# Patient Record
Sex: Female | Born: 1935 | Race: White | Hispanic: No | Smoking: Former smoker
Health system: Southern US, Community
[De-identification: ages and names within clinical notes are randomized; demographics above are authoritative.]

## PROBLEM LIST (undated history)

## (undated) DIAGNOSIS — I2510 Atherosclerotic heart disease of native coronary artery without angina pectoris: Secondary | ICD-10-CM

## (undated) DIAGNOSIS — I4729 Other ventricular tachycardia: Secondary | ICD-10-CM

## (undated) DIAGNOSIS — I350 Nonrheumatic aortic (valve) stenosis: Secondary | ICD-10-CM

## (undated) DIAGNOSIS — J449 Chronic obstructive pulmonary disease, unspecified: Secondary | ICD-10-CM

## (undated) DIAGNOSIS — I255 Ischemic cardiomyopathy: Secondary | ICD-10-CM

## (undated) DIAGNOSIS — F0390 Unspecified dementia without behavioral disturbance: Secondary | ICD-10-CM

## (undated) DIAGNOSIS — E119 Type 2 diabetes mellitus without complications: Secondary | ICD-10-CM

## (undated) DIAGNOSIS — I10 Essential (primary) hypertension: Secondary | ICD-10-CM

## (undated) DIAGNOSIS — F32A Depression, unspecified: Secondary | ICD-10-CM

## (undated) DIAGNOSIS — J45909 Unspecified asthma, uncomplicated: Secondary | ICD-10-CM

## (undated) DIAGNOSIS — Z9861 Coronary angioplasty status: Principal | ICD-10-CM

## (undated) DIAGNOSIS — I472 Ventricular tachycardia: Secondary | ICD-10-CM

## (undated) DIAGNOSIS — M47816 Spondylosis without myelopathy or radiculopathy, lumbar region: Secondary | ICD-10-CM

## (undated) DIAGNOSIS — F419 Anxiety disorder, unspecified: Secondary | ICD-10-CM

## (undated) DIAGNOSIS — R739 Hyperglycemia, unspecified: Secondary | ICD-10-CM

## (undated) DIAGNOSIS — E7800 Pure hypercholesterolemia, unspecified: Secondary | ICD-10-CM

## (undated) DIAGNOSIS — F329 Major depressive disorder, single episode, unspecified: Secondary | ICD-10-CM

## (undated) DIAGNOSIS — I2102 ST elevation (STEMI) myocardial infarction involving left anterior descending coronary artery: Secondary | ICD-10-CM

## (undated) HISTORY — DX: Depression, unspecified: F32.A

## (undated) HISTORY — DX: Pure hypercholesterolemia, unspecified: E78.00

## (undated) HISTORY — DX: Type 2 diabetes mellitus without complications: E11.9

## (undated) HISTORY — DX: Unspecified asthma, uncomplicated: J45.909

## (undated) HISTORY — DX: Unspecified dementia, unspecified severity, without behavioral disturbance, psychotic disturbance, mood disturbance, and anxiety: F03.90

## (undated) HISTORY — DX: Nonrheumatic aortic (valve) stenosis: I35.0

## (undated) HISTORY — DX: Essential (primary) hypertension: I10

## (undated) HISTORY — DX: Coronary angioplasty status: Z98.61

## (undated) HISTORY — DX: Atherosclerotic heart disease of native coronary artery without angina pectoris: I25.10

## (undated) HISTORY — DX: Chronic obstructive pulmonary disease, unspecified: J44.9

## (undated) HISTORY — DX: Hyperglycemia, unspecified: R73.9

## (undated) HISTORY — DX: Major depressive disorder, single episode, unspecified: F32.9

## (undated) HISTORY — PX: FRACTURE SURGERY: SHX138

## (undated) HISTORY — PX: CARDIAC SURGERY: SHX584

## (undated) HISTORY — DX: ST elevation (STEMI) myocardial infarction involving left anterior descending coronary artery: I21.02

## (undated) HISTORY — PX: APPENDECTOMY: SHX54

## (undated) HISTORY — DX: Anxiety disorder, unspecified: F41.9

---

## 1998-01-05 ENCOUNTER — Encounter: Payer: Self-pay | Admitting: Gastroenterology

## 1998-01-05 ENCOUNTER — Inpatient Hospital Stay (HOSPITAL_COMMUNITY): Admission: EM | Admit: 1998-01-05 | Discharge: 1998-01-20 | Payer: Self-pay | Admitting: Emergency Medicine

## 1998-01-07 ENCOUNTER — Encounter: Payer: Self-pay | Admitting: Gastroenterology

## 1998-01-10 ENCOUNTER — Encounter: Payer: Self-pay | Admitting: Pulmonary Disease

## 1998-01-10 ENCOUNTER — Encounter: Payer: Self-pay | Admitting: Gastroenterology

## 1998-01-11 ENCOUNTER — Encounter: Payer: Self-pay | Admitting: Pulmonary Disease

## 1998-01-12 ENCOUNTER — Encounter: Payer: Self-pay | Admitting: Pulmonary Disease

## 1998-01-13 ENCOUNTER — Encounter: Payer: Self-pay | Admitting: Pulmonary Disease

## 1998-01-14 ENCOUNTER — Encounter: Payer: Self-pay | Admitting: Pulmonary Disease

## 1998-01-18 ENCOUNTER — Encounter: Payer: Self-pay | Admitting: Gastroenterology

## 1998-05-18 ENCOUNTER — Ambulatory Visit (HOSPITAL_BASED_OUTPATIENT_CLINIC_OR_DEPARTMENT_OTHER): Admission: RE | Admit: 1998-05-18 | Discharge: 1998-05-18 | Payer: Self-pay | Admitting: Orthopedic Surgery

## 1998-05-25 ENCOUNTER — Other Ambulatory Visit: Admission: RE | Admit: 1998-05-25 | Discharge: 1998-05-25 | Payer: Self-pay | Admitting: Family Medicine

## 1998-06-15 ENCOUNTER — Ambulatory Visit (HOSPITAL_BASED_OUTPATIENT_CLINIC_OR_DEPARTMENT_OTHER): Admission: RE | Admit: 1998-06-15 | Discharge: 1998-06-15 | Payer: Self-pay | Admitting: Orthopedic Surgery

## 1998-06-23 ENCOUNTER — Encounter: Admission: RE | Admit: 1998-06-23 | Discharge: 1998-08-13 | Payer: Self-pay | Admitting: Orthopedic Surgery

## 1998-08-17 ENCOUNTER — Encounter: Admission: RE | Admit: 1998-08-17 | Discharge: 1998-09-15 | Payer: Self-pay | Admitting: Orthopedic Surgery

## 2000-05-21 ENCOUNTER — Encounter: Payer: Self-pay | Admitting: Internal Medicine

## 2000-05-21 ENCOUNTER — Emergency Department (HOSPITAL_COMMUNITY): Admission: EM | Admit: 2000-05-21 | Discharge: 2000-05-21 | Payer: Self-pay | Admitting: Internal Medicine

## 2000-06-20 ENCOUNTER — Encounter: Payer: Self-pay | Admitting: Orthopedic Surgery

## 2000-06-20 ENCOUNTER — Encounter: Admission: RE | Admit: 2000-06-20 | Discharge: 2000-06-20 | Payer: Self-pay | Admitting: Orthopedic Surgery

## 2000-08-31 ENCOUNTER — Encounter: Payer: Self-pay | Admitting: Neurosurgery

## 2000-08-31 ENCOUNTER — Encounter: Admission: RE | Admit: 2000-08-31 | Discharge: 2000-08-31 | Payer: Self-pay | Admitting: Neurosurgery

## 2000-09-21 ENCOUNTER — Encounter: Payer: Self-pay | Admitting: Thoracic Surgery

## 2000-09-21 ENCOUNTER — Encounter: Admission: RE | Admit: 2000-09-21 | Discharge: 2000-09-21 | Payer: Self-pay | Admitting: Thoracic Surgery

## 2000-11-20 ENCOUNTER — Encounter: Admission: RE | Admit: 2000-11-20 | Discharge: 2000-11-20 | Payer: Self-pay | Admitting: Thoracic Surgery

## 2000-11-20 ENCOUNTER — Encounter: Payer: Self-pay | Admitting: Thoracic Surgery

## 2001-06-13 ENCOUNTER — Emergency Department (HOSPITAL_COMMUNITY): Admission: EM | Admit: 2001-06-13 | Discharge: 2001-06-13 | Payer: Self-pay | Admitting: Emergency Medicine

## 2001-10-28 ENCOUNTER — Inpatient Hospital Stay (HOSPITAL_COMMUNITY): Admission: EM | Admit: 2001-10-28 | Discharge: 2001-11-05 | Payer: Self-pay | Admitting: Emergency Medicine

## 2001-10-28 ENCOUNTER — Encounter: Payer: Self-pay | Admitting: Gastroenterology

## 2001-10-28 ENCOUNTER — Encounter: Payer: Self-pay | Admitting: General Surgery

## 2004-03-03 ENCOUNTER — Inpatient Hospital Stay (HOSPITAL_COMMUNITY): Admission: EM | Admit: 2004-03-03 | Discharge: 2004-03-11 | Payer: Self-pay | Admitting: Emergency Medicine

## 2004-03-08 ENCOUNTER — Encounter: Payer: Self-pay | Admitting: Cardiology

## 2005-01-04 ENCOUNTER — Inpatient Hospital Stay (HOSPITAL_COMMUNITY): Admission: EM | Admit: 2005-01-04 | Discharge: 2005-01-10 | Payer: Self-pay | Admitting: Emergency Medicine

## 2005-06-13 ENCOUNTER — Inpatient Hospital Stay (HOSPITAL_COMMUNITY): Admission: EM | Admit: 2005-06-13 | Discharge: 2005-06-15 | Payer: Self-pay | Admitting: Emergency Medicine

## 2010-06-13 ENCOUNTER — Emergency Department (HOSPITAL_COMMUNITY): Payer: No Typology Code available for payment source

## 2010-06-13 ENCOUNTER — Emergency Department (HOSPITAL_COMMUNITY)
Admission: EM | Admit: 2010-06-13 | Discharge: 2010-06-13 | Disposition: A | Discharge status: Home / Self Care | Payer: No Typology Code available for payment source | Attending: Emergency Medicine | Admitting: Emergency Medicine

## 2010-06-13 DIAGNOSIS — S60229A Contusion of unspecified hand, initial encounter: Secondary | ICD-10-CM | POA: Insufficient documentation

## 2010-06-13 DIAGNOSIS — M79609 Pain in unspecified limb: Secondary | ICD-10-CM | POA: Insufficient documentation

## 2010-06-13 DIAGNOSIS — IMO0002 Reserved for concepts with insufficient information to code with codable children: Secondary | ICD-10-CM | POA: Insufficient documentation

## 2010-06-13 DIAGNOSIS — I10 Essential (primary) hypertension: Secondary | ICD-10-CM | POA: Insufficient documentation

## 2010-06-13 DIAGNOSIS — Y929 Unspecified place or not applicable: Secondary | ICD-10-CM | POA: Insufficient documentation

## 2010-06-13 DIAGNOSIS — S5010XA Contusion of unspecified forearm, initial encounter: Secondary | ICD-10-CM | POA: Insufficient documentation

## 2012-01-20 ENCOUNTER — Emergency Department (HOSPITAL_COMMUNITY)
Admission: EM | Admit: 2012-01-20 | Discharge: 2012-01-20 | Disposition: A | Discharge status: Home / Self Care | Payer: Medicare Other | Attending: Emergency Medicine | Admitting: Emergency Medicine

## 2012-01-20 ENCOUNTER — Encounter (HOSPITAL_COMMUNITY): Payer: Self-pay | Admitting: Emergency Medicine

## 2012-01-20 ENCOUNTER — Emergency Department (HOSPITAL_COMMUNITY): Payer: Medicare Other

## 2012-01-20 DIAGNOSIS — S32509A Unspecified fracture of unspecified pubis, initial encounter for closed fracture: Secondary | ICD-10-CM | POA: Insufficient documentation

## 2012-01-20 DIAGNOSIS — Y9301 Activity, walking, marching and hiking: Secondary | ICD-10-CM | POA: Insufficient documentation

## 2012-01-20 DIAGNOSIS — F17200 Nicotine dependence, unspecified, uncomplicated: Secondary | ICD-10-CM | POA: Insufficient documentation

## 2012-01-20 DIAGNOSIS — R296 Repeated falls: Secondary | ICD-10-CM | POA: Insufficient documentation

## 2012-01-20 DIAGNOSIS — Z7982 Long term (current) use of aspirin: Secondary | ICD-10-CM | POA: Insufficient documentation

## 2012-01-20 DIAGNOSIS — Z8739 Personal history of other diseases of the musculoskeletal system and connective tissue: Secondary | ICD-10-CM | POA: Insufficient documentation

## 2012-01-20 DIAGNOSIS — R6883 Chills (without fever): Secondary | ICD-10-CM | POA: Insufficient documentation

## 2012-01-20 DIAGNOSIS — Y9289 Other specified places as the place of occurrence of the external cause: Secondary | ICD-10-CM | POA: Insufficient documentation

## 2012-01-20 DIAGNOSIS — S32599A Other specified fracture of unspecified pubis, initial encounter for closed fracture: Secondary | ICD-10-CM

## 2012-01-20 HISTORY — DX: Spondylosis without myelopathy or radiculopathy, lumbar region: M47.816

## 2012-01-20 MED ORDER — DOCUSATE SODIUM 100 MG PO CAPS: 100.0000 mg | ORAL_CAPSULE | Freq: Two times a day (BID) | ORAL | Status: DC

## 2012-01-20 MED ORDER — MORPHINE SULFATE 4 MG/ML IJ SOLN
6.0000 mg | Freq: Once | INTRAMUSCULAR | Status: AC
  Administered 2012-01-20: 6 mg via INTRAMUSCULAR
  Filled 2012-01-20: qty 2

## 2012-01-20 MED ORDER — HYDROCODONE-ACETAMINOPHEN 5-325 MG PO TABS: ORAL_TABLET | ORAL | Status: DC

## 2012-01-20 NOTE — ED Notes (Signed)
WUJ:WJ19<JY> Expected date:01/20/12<BR> Expected time: 8:38 AM<BR> Means of arrival:Ambulance<BR> Comments:<BR> Fall last night

## 2012-01-20 NOTE — ED Notes (Signed)
Per EMS - Pt reportedly fell outside in her yard yesterday evening landing on her buttock. No loss of consciousness, able to ambulated w/o difficulty yesterday. This morning difficulty w/ ambulation, left hip and leg painful. No shortening or external rotation noted. BP - 130/94, HR - 80, Resp - 18.

## 2012-01-20 NOTE — Progress Notes (Signed)
Cm spoke with patient concerning MD order for [facility] services. Pt offered choice of H[facility] agencies. Per pt choice [facility] to provide [facility] services. [facility] notified of referral. Pt request DME, [facility] to deliver DME to bedside. No other needs noted.   Karina Lenderman,RN,BSN

## 2012-01-20 NOTE — Discharge Instructions (Signed)
 Pelvic Fracture, Adult You have a fracture (this means there is a break in the bones) of the pelvis. The pelvis is the ring of bones that make up your hipbones. These are the bones you sit on and the lower part of the spine. It is like a boney ring where your legs attach and which supports your upper body. You have an un-displaced fracture. This means the bones are in good position. The pelvic fracture you have is a simple (uncomplicated) fracture. DIAGNOSIS  X-rays usually diagnose these fractures. TREATMENT  The goals of treating pelvic fractures are to get the bones to heal in a good position. The patient should return to normal activities as soon as possible. Such fractures are often treated with normal bed rest and conservative measures.  HOME CARE INSTRUCTIONS   You should be at bed rest for as long as directed by your caregiver. Following this, you may do usual activities, but avoid strenuous activities for as long as directed by your caregiver.  Only take over-the-counter or prescription medicines for pain, discomfort, or fever as directed by your caregiver.  Bed-rest may also be used for discomfort.  Resume your activities when you are able.  If you develop increased pain or discomfort not relieved with medications, contact your caregiver.  Warning: Do not drive a car or operate a motor vehicle until your caregiver specifically tells you it is safe to do so. SEEK IMMEDIATE MEDICAL CARE IF:   You feel light-headed or faint, develop chest pain or shortness of breath.  An unexplained oral temperature above 102 F (38.9 C) develops.  You develop blood in the urine or in the stools.  There is difficulty urinating, and/or having a bowel movement, or pain with these efforts.  There is a difficulty or increased pain with walking.  There is swelling in one or both legs that is not normal. Document Released: 03/13/2001 Document Revised: 03/27/2011 Document Reviewed:  08/16/2007 [facility] Patient Information 2013 [place], [place].    Narcotic and benzodiazepine use may cause drowsiness, slowed breathing or dependence.  Please use with caution and do not drive, operate machinery or watch young children alone while taking them.  Taking combinations of these medications or drinking alcohol will potentiate these effects.  Narcotics can lead to constipation so consider taking stool softeners while taking narcotic pain medication as well such as colace.

## 2012-01-20 NOTE — ED Provider Notes (Signed)
History     CSN: [id number]  Arrival date & time 01/20/12  0844   First MD Initiated Contact with Patient 01/20/12 [phone]      Chief Complaint  Patient presents with  . Fall  . Leg Pain    (Consider location/radiation/quality/duration/timing/severity/associated sxs/prior treatment) HPI Comments: Patient reports that last night while outside walking her dog, the dog pulled her and she lost her balance and fell onto her left hip. She reports she was able to get up and ambulate and had no obvious deformity. She reports no head injury, loss of consciousness. Chills reports no injuries above her waist. She denies any distal numbness or weakness. She denies any abrasions or lacerations. She is not on any blood thinning medications. She reports that she did take some Tylenol and aspirin last night which did help her discomfort. However overnight and this morning, medications did not help her pain and she reports bearing weight or travel lift her leg in the air causes the discomfort to be much worse. She reports that she does live with her son.   Patient is a 79 y.o. female presenting with fall and leg pain. The history is provided by the patient and the EMS personnel.  Fall Pertinent negatives include no numbness, no abdominal pain and no headaches.  Leg Pain  Pertinent negatives include no numbness.    Past Medical History  Diagnosis Date  . Degenerative arthritis of lumbar spine     Past Surgical History  Procedure Date  . Cardiac surgery     pericardial window  . Appendectomy     History reviewed. No pertinent family history.  History  Substance Use Topics  . Smoking status: Current Every Day Smoker -- 0.5 packs/day    Types: Cigarettes  . Smokeless tobacco: Never Used  . Alcohol Use: No    OB History    Grav Para Term Preterm Abortions TAB SAB Ect Mult Living                  Review of Systems  HENT: Negative for neck pain.   Respiratory: Negative for shortness of  breath.   Cardiovascular: Negative for chest pain.  Gastrointestinal: Negative for abdominal pain.  Musculoskeletal: Positive for arthralgias.  Skin: Negative for wound.  Neurological: Negative for syncope, weakness, numbness and headaches.  All other systems reviewed and are negative.    Allergies  Review of patient's allergies indicates no known allergies.  Home Medications   Current Outpatient Rx  Name  Route  Sig  Dispense  Refill  . ASPIRIN 325 MG PO TABS   Oral   Take 650 mg by mouth daily.         Marland Kitchen DOCUSATE SODIUM 100 MG PO CAPS   Oral   Take 1 capsule (100 mg total) by mouth every 12 (twelve) hours.   30 capsule   0   . HYDROCODONE-ACETAMINOPHEN 5-325 MG PO TABS      1-2 tablets po q 6 hours prn moderate to severe pain   20 tablet   0     BP 142/70  Pulse 93  Temp 98.8 F (37.1 C) (Oral)  Resp 20  SpO2 95%  Physical Exam  Nursing note and vitals reviewed. Constitutional: She appears well-developed and well-nourished.  HENT:  Head: Normocephalic and atraumatic.  Eyes: Pupils are equal, round, and reactive to light. No scleral icterus.  Neck: Normal range of motion. Neck supple.  Cardiovascular: Normal rate, regular rhythm, intact distal pulses  and normal pulses.  Exam reveals no decreased pulses.   No murmur heard. Pulmonary/Chest: Effort normal. No respiratory distress. She has no wheezes.  Abdominal: Soft. She exhibits no distension. There is no tenderness. There is no rebound and no guarding.  Musculoskeletal:       Left hip: She exhibits decreased range of motion and tenderness. She exhibits no crepitus.       Lumbar back: She exhibits no tenderness, no bony tenderness, no pain, no spasm and normal pulse.  Neurological: She is alert. Coordination normal.  Skin: Skin is warm and dry. No rash noted.  Psychiatric: She has a normal mood and affect.    ED Course  Procedures (including critical care time)  Labs Reviewed - No data to display Dg  Hip Complete Left  01/20/2012  *RADIOLOGY REPORT*  Clinical Data: Fall, left hip pain  LEFT HIP - COMPLETE 2+ VIEW  Comparison: No similar prior study is available for comparison.  Findings: Fractures of the left superior and inferior pubic rami are identified.  No left femoral fracture or dislocation.  Mild left hip degenerative change.  Vascular calcifications are noted. Lumbar degenerative change is present.  Sacroiliac joints are within normal limits.  Moderate stool volume noted throughout nondilated visualized portions of colon.  IMPRESSION: Acute left superior and inferior pubic rami fractures.   Original Report Authenticated By: Christiana Pellant, M.D.      1. Fracture of multiple pubic rami     ra sat is 95% and I interpret to be adequate  MDM   I reviewed multiple view pelvic and left hip films myself. I agree with the radiologist interpretation of pubic rami fractures. Patient and family are told results. I feel the patient is able to go home and have given referrals to [facility]. She is prescribed Colace and Norco. I've also ordered a walker as well as recommended case management to assess patient for home health aide and physical therapy.        Gavin Pound. Adalee Kathan, MD 01/20/12 1027

## 2012-01-20 NOTE — ED Notes (Signed)
CSW notified CM of HH request by EDP. CM will provide assistance.   Janann Colonel., MSW, [facility] Weekend ED Clinical Social Worker [phone]

## 2012-01-20 NOTE — ED Notes (Signed)
Discharge instructions reviewed w/ pt., verbalizes understanding. Two prescriptions provided at discharge. 

## 2012-01-20 NOTE — ED Notes (Signed)
Pt was outside walking her dog last p.m., dog chased a cat, jerking the lease causing the pt to fall on her buttock.

## 2013-01-17 DIAGNOSIS — H3560 Retinal hemorrhage, unspecified eye: Secondary | ICD-10-CM | POA: Diagnosis not present

## 2013-01-17 DIAGNOSIS — H353290 Exudative age-related macular degeneration, unspecified eye, stage unspecified: Secondary | ICD-10-CM | POA: Diagnosis not present

## 2013-01-17 DIAGNOSIS — H35059 Retinal neovascularization, unspecified, unspecified eye: Secondary | ICD-10-CM | POA: Diagnosis not present

## 2013-04-01 DIAGNOSIS — H35369 Drusen (degenerative) of macula, unspecified eye: Secondary | ICD-10-CM | POA: Diagnosis not present

## 2013-04-01 DIAGNOSIS — H353290 Exudative age-related macular degeneration, unspecified eye, stage unspecified: Secondary | ICD-10-CM | POA: Diagnosis not present

## 2013-04-01 DIAGNOSIS — H3560 Retinal hemorrhage, unspecified eye: Secondary | ICD-10-CM | POA: Diagnosis not present

## 2013-07-01 DIAGNOSIS — H353290 Exudative age-related macular degeneration, unspecified eye, stage unspecified: Secondary | ICD-10-CM | POA: Diagnosis not present

## 2013-07-01 DIAGNOSIS — H35059 Retinal neovascularization, unspecified, unspecified eye: Secondary | ICD-10-CM | POA: Diagnosis not present

## 2013-07-07 DIAGNOSIS — H353190 Nonexudative age-related macular degeneration, unspecified eye, stage unspecified: Secondary | ICD-10-CM | POA: Diagnosis not present

## 2013-07-07 DIAGNOSIS — H524 Presbyopia: Secondary | ICD-10-CM | POA: Diagnosis not present

## 2013-07-07 DIAGNOSIS — H353290 Exudative age-related macular degeneration, unspecified eye, stage unspecified: Secondary | ICD-10-CM | POA: Diagnosis not present

## 2013-07-07 DIAGNOSIS — Z961 Presence of intraocular lens: Secondary | ICD-10-CM | POA: Diagnosis not present

## 2013-10-28 DIAGNOSIS — H3532 Exudative age-related macular degeneration: Secondary | ICD-10-CM | POA: Diagnosis not present

## 2013-10-28 DIAGNOSIS — H35362 Drusen (degenerative) of macula, left eye: Secondary | ICD-10-CM | POA: Diagnosis not present

## 2013-10-28 DIAGNOSIS — H35361 Drusen (degenerative) of macula, right eye: Secondary | ICD-10-CM | POA: Diagnosis not present

## 2014-08-17 DIAGNOSIS — I350 Nonrheumatic aortic (valve) stenosis: Secondary | ICD-10-CM | POA: Insufficient documentation

## 2014-08-17 DIAGNOSIS — I2510 Atherosclerotic heart disease of native coronary artery without angina pectoris: Secondary | ICD-10-CM

## 2014-08-17 DIAGNOSIS — Z9861 Coronary angioplasty status: Secondary | ICD-10-CM

## 2014-08-17 HISTORY — DX: Atherosclerotic heart disease of native coronary artery without angina pectoris: I25.10

## 2014-08-17 HISTORY — DX: Coronary angioplasty status: Z98.61

## 2014-09-11 ENCOUNTER — Inpatient Hospital Stay (HOSPITAL_COMMUNITY)
Admission: EM | Admit: 2014-09-11 | Discharge: 2014-09-14 | DRG: 247 | Disposition: A | Discharge status: Home / Self Care | Payer: Medicare Other | Attending: Interventional Cardiology | Admitting: Interventional Cardiology

## 2014-09-11 DIAGNOSIS — Z23 Encounter for immunization: Secondary | ICD-10-CM

## 2014-09-11 DIAGNOSIS — I110 Hypertensive heart disease with heart failure: Secondary | ICD-10-CM

## 2014-09-11 DIAGNOSIS — I213 ST elevation (STEMI) myocardial infarction of unspecified site: Secondary | ICD-10-CM | POA: Diagnosis present

## 2014-09-11 DIAGNOSIS — R079 Chest pain, unspecified: Secondary | ICD-10-CM | POA: Diagnosis not present

## 2014-09-11 DIAGNOSIS — I4729 Other ventricular tachycardia: Secondary | ICD-10-CM

## 2014-09-11 DIAGNOSIS — I350 Nonrheumatic aortic (valve) stenosis: Secondary | ICD-10-CM

## 2014-09-11 DIAGNOSIS — Z9861 Coronary angioplasty status: Secondary | ICD-10-CM

## 2014-09-11 DIAGNOSIS — F0390 Unspecified dementia without behavioral disturbance: Secondary | ICD-10-CM | POA: Diagnosis present

## 2014-09-11 DIAGNOSIS — E785 Hyperlipidemia, unspecified: Secondary | ICD-10-CM | POA: Diagnosis present

## 2014-09-11 DIAGNOSIS — Z7982 Long term (current) use of aspirin: Secondary | ICD-10-CM

## 2014-09-11 DIAGNOSIS — I428 Other cardiomyopathies: Secondary | ICD-10-CM | POA: Diagnosis present

## 2014-09-11 DIAGNOSIS — R739 Hyperglycemia, unspecified: Secondary | ICD-10-CM

## 2014-09-11 DIAGNOSIS — R0789 Other chest pain: Secondary | ICD-10-CM | POA: Diagnosis not present

## 2014-09-11 DIAGNOSIS — I2102 ST elevation (STEMI) myocardial infarction involving left anterior descending coronary artery: Principal | ICD-10-CM | POA: Diagnosis present

## 2014-09-11 DIAGNOSIS — Z79899 Other long term (current) drug therapy: Secondary | ICD-10-CM

## 2014-09-11 DIAGNOSIS — I255 Ischemic cardiomyopathy: Secondary | ICD-10-CM

## 2014-09-11 DIAGNOSIS — R011 Cardiac murmur, unspecified: Secondary | ICD-10-CM

## 2014-09-11 DIAGNOSIS — M1990 Unspecified osteoarthritis, unspecified site: Secondary | ICD-10-CM | POA: Diagnosis present

## 2014-09-11 DIAGNOSIS — F17200 Nicotine dependence, unspecified, uncomplicated: Secondary | ICD-10-CM | POA: Diagnosis present

## 2014-09-11 DIAGNOSIS — I2510 Atherosclerotic heart disease of native coronary artery without angina pectoris: Secondary | ICD-10-CM

## 2014-09-11 DIAGNOSIS — I472 Ventricular tachycardia: Secondary | ICD-10-CM | POA: Diagnosis not present

## 2014-09-11 DIAGNOSIS — I10 Essential (primary) hypertension: Secondary | ICD-10-CM | POA: Diagnosis present

## 2014-09-11 DIAGNOSIS — I252 Old myocardial infarction: Secondary | ICD-10-CM

## 2014-09-11 HISTORY — DX: Ischemic cardiomyopathy: I25.5

## 2014-09-11 HISTORY — DX: Ventricular tachycardia: I47.2

## 2014-09-11 HISTORY — DX: Essential (primary) hypertension: I10

## 2014-09-11 HISTORY — DX: Other ventricular tachycardia: I47.29

## 2014-09-11 NOTE — ED Notes (Addendum)
Per [facility] : Pt started having chest pain 10 pm, 10/10 pain, sharp, sudden onset. 15 minutes went by, pain would come and go, and started radiating down the left arm. Pt took 325 PO ASA. Pt called EMS about 1 hour later. [facility] on scene around 11;15 pm/ Has had open heart sx in the 60's, no meds, takes ibuprohen for back pain. No known heart history.

## 2014-09-12 ENCOUNTER — Encounter (HOSPITAL_COMMUNITY): Payer: Self-pay | Admitting: Certified Registered Nurse Anesthetist

## 2014-09-12 ENCOUNTER — Emergency Department (HOSPITAL_COMMUNITY): Payer: Medicare Other

## 2014-09-12 ENCOUNTER — Encounter (HOSPITAL_COMMUNITY): Payer: Self-pay

## 2014-09-12 ENCOUNTER — Encounter (HOSPITAL_COMMUNITY)
Admission: EM | Disposition: A | Discharge status: Home / Self Care | Payer: Medicare Other | Source: Home / Self Care | Attending: Interventional Cardiology

## 2014-09-12 DIAGNOSIS — I2510 Atherosclerotic heart disease of native coronary artery without angina pectoris: Secondary | ICD-10-CM | POA: Diagnosis not present

## 2014-09-12 DIAGNOSIS — I252 Old myocardial infarction: Secondary | ICD-10-CM | POA: Diagnosis not present

## 2014-09-12 DIAGNOSIS — I472 Ventricular tachycardia: Secondary | ICD-10-CM | POA: Diagnosis not present

## 2014-09-12 DIAGNOSIS — I2102 ST elevation (STEMI) myocardial infarction involving left anterior descending coronary artery: Secondary | ICD-10-CM | POA: Diagnosis not present

## 2014-09-12 DIAGNOSIS — I350 Nonrheumatic aortic (valve) stenosis: Secondary | ICD-10-CM | POA: Diagnosis not present

## 2014-09-12 DIAGNOSIS — I428 Other cardiomyopathies: Secondary | ICD-10-CM | POA: Diagnosis present

## 2014-09-12 DIAGNOSIS — I213 ST elevation (STEMI) myocardial infarction of unspecified site: Secondary | ICD-10-CM | POA: Diagnosis present

## 2014-09-12 DIAGNOSIS — Z9861 Coronary angioplasty status: Secondary | ICD-10-CM | POA: Diagnosis not present

## 2014-09-12 DIAGNOSIS — Z7982 Long term (current) use of aspirin: Secondary | ICD-10-CM | POA: Diagnosis not present

## 2014-09-12 DIAGNOSIS — Z23 Encounter for immunization: Secondary | ICD-10-CM | POA: Diagnosis not present

## 2014-09-12 DIAGNOSIS — R739 Hyperglycemia, unspecified: Secondary | ICD-10-CM | POA: Diagnosis not present

## 2014-09-12 DIAGNOSIS — R079 Chest pain, unspecified: Secondary | ICD-10-CM | POA: Diagnosis not present

## 2014-09-12 DIAGNOSIS — R011 Cardiac murmur, unspecified: Secondary | ICD-10-CM | POA: Diagnosis present

## 2014-09-12 DIAGNOSIS — M1990 Unspecified osteoarthritis, unspecified site: Secondary | ICD-10-CM | POA: Diagnosis present

## 2014-09-12 DIAGNOSIS — Z79899 Other long term (current) drug therapy: Secondary | ICD-10-CM | POA: Diagnosis not present

## 2014-09-12 DIAGNOSIS — I10 Essential (primary) hypertension: Secondary | ICD-10-CM | POA: Diagnosis present

## 2014-09-12 DIAGNOSIS — I255 Ischemic cardiomyopathy: Secondary | ICD-10-CM | POA: Diagnosis not present

## 2014-09-12 DIAGNOSIS — E785 Hyperlipidemia, unspecified: Secondary | ICD-10-CM | POA: Diagnosis present

## 2014-09-12 DIAGNOSIS — F17200 Nicotine dependence, unspecified, uncomplicated: Secondary | ICD-10-CM | POA: Diagnosis present

## 2014-09-12 HISTORY — DX: ST elevation (STEMI) myocardial infarction involving left anterior descending coronary artery: I21.02

## 2014-09-12 HISTORY — PX: CARDIAC CATHETERIZATION: SHX172

## 2014-09-12 LAB — CBC WITH DIFFERENTIAL/PLATELET
BASOS PCT: 0 % (ref 0–1)
Basophils Absolute: 0 10*3/uL (ref 0.0–0.1)
Eosinophils Absolute: 0 10*3/uL (ref 0.0–0.7)
Eosinophils Relative: 0 % (ref 0–5)
HEMATOCRIT: 35.8 % — AB (ref 36.0–46.0)
HEMOGLOBIN: 11.6 g/dL — AB (ref 12.0–15.0)
Lymphocytes Relative: 6 % — ABNORMAL LOW (ref 12–46)
Lymphs Abs: 0.6 10*3/uL — ABNORMAL LOW (ref 0.7–4.0)
MCH: 29.5 pg (ref 26.0–34.0)
MCHC: 32.4 g/dL (ref 30.0–36.0)
MCV: 91.1 fL (ref 78.0–100.0)
MONOS PCT: 5 % (ref 3–12)
Monocytes Absolute: 0.5 10*3/uL (ref 0.1–1.0)
NEUTROS ABS: 8.9 10*3/uL — AB (ref 1.7–7.7)
NEUTROS PCT: 89 % — AB (ref 43–77)
Platelets: 193 10*3/uL (ref 150–400)
RBC: 3.93 MIL/uL (ref 3.87–5.11)
RDW: 14.1 % (ref 11.5–15.5)
WBC: 10 10*3/uL (ref 4.0–10.5)

## 2014-09-12 LAB — CBC
HCT: 36.6 % (ref 36.0–46.0)
HEMATOCRIT: 43.1 % (ref 36.0–46.0)
HEMOGLOBIN: 11.9 g/dL — AB (ref 12.0–15.0)
HEMOGLOBIN: 14.1 g/dL (ref 12.0–15.0)
MCH: 29.5 pg (ref 26.0–34.0)
MCH: 30.1 pg (ref 26.0–34.0)
MCHC: 32.5 g/dL (ref 30.0–36.0)
MCHC: 32.7 g/dL (ref 30.0–36.0)
MCV: 90.6 fL (ref 78.0–100.0)
MCV: 92.1 fL (ref 78.0–100.0)
Platelets: 182 10*3/uL (ref 150–400)
Platelets: 167 10*3/uL (ref 150–400)
RBC: 4.04 MIL/uL (ref 3.87–5.11)
RBC: 4.68 MIL/uL (ref 3.87–5.11)
RDW: 14.1 % (ref 11.5–15.5)
RDW: 14.4 % (ref 11.5–15.5)
WBC: 9.3 10*3/uL (ref 4.0–10.5)
WBC: 7.5 10*3/uL (ref 4.0–10.5)

## 2014-09-12 LAB — COMPREHENSIVE METABOLIC PANEL
ALBUMIN: 3.3 g/dL — AB (ref 3.5–5.0)
ALK PHOS: 90 U/L (ref 38–126)
ALT: 18 U/L (ref 14–54)
AST: 37 U/L (ref 15–41)
Anion gap: 8 (ref 5–15)
BILIRUBIN TOTAL: 1.3 mg/dL — AB (ref 0.3–1.2)
BUN: 25 mg/dL — AB (ref 6–20)
CALCIUM: 8 mg/dL — AB (ref 8.9–10.3)
CO2: 24 mmol/L (ref 22–32)
CREATININE: 1.09 mg/dL — AB (ref 0.44–1.00)
Chloride: 102 mmol/L (ref 101–111)
GFR calc Af Amer: 54 mL/min — ABNORMAL LOW (ref >60)
GFR calc non Af Amer: 46 mL/min — ABNORMAL LOW (ref >60)
GLUCOSE: 146 mg/dL — AB (ref 65–99)
Potassium: 5 mmol/L (ref 3.5–5.1)
Sodium: 134 mmol/L — ABNORMAL LOW (ref 135–145)
TOTAL PROTEIN: 6.1 g/dL — AB (ref 6.5–8.1)

## 2014-09-12 LAB — BASIC METABOLIC PANEL
ANION GAP: 9 (ref 5–15)
Anion gap: 8 (ref 5–15)
BUN: 23 mg/dL — ABNORMAL HIGH (ref 6–20)
BUN: 21 mg/dL — ABNORMAL HIGH (ref 6–20)
CALCIUM: 8.4 mg/dL — AB (ref 8.9–10.3)
CHLORIDE: 102 mmol/L (ref 101–111)
CO2: 23 mmol/L (ref 22–32)
CO2: 23 mmol/L (ref 22–32)
CREATININE: 0.87 mg/dL (ref 0.44–1.00)
Calcium: 7.9 mg/dL — ABNORMAL LOW (ref 8.9–10.3)
Chloride: 105 mmol/L (ref 101–111)
Creatinine, Ser: 0.89 mg/dL (ref 0.44–1.00)
GFR calc non Af Amer: 59 mL/min — ABNORMAL LOW (ref >60)
GFR calc non Af Amer: >60 mL/min (ref >60)
Glucose, Bld: 129 mg/dL — ABNORMAL HIGH (ref 65–99)
Glucose, Bld: 134 mg/dL — ABNORMAL HIGH (ref 65–99)
Potassium: 4.1 mmol/L (ref 3.5–5.1)
Potassium: 4.6 mmol/L (ref 3.5–5.1)
SODIUM: 136 mmol/L (ref 135–145)
Sodium: 134 mmol/L — ABNORMAL LOW (ref 135–145)

## 2014-09-12 LAB — LIPID PANEL
CHOLESTEROL: 129 mg/dL (ref 0–200)
HDL: 38 mg/dL — ABNORMAL LOW (ref >40)
LDL CALC: 84 mg/dL (ref 0–99)
TRIGLYCERIDES: 35 mg/dL (ref <150)
Total CHOL/HDL Ratio: 3.4 RATIO
VLDL: 7 mg/dL (ref 0–40)

## 2014-09-12 LAB — POCT I-STAT, CHEM 8
BUN: 26 mg/dL — AB (ref 6–20)
Calcium, Ion: 1.05 mmol/L — ABNORMAL LOW (ref 1.13–1.30)
Chloride: 96 mmol/L — ABNORMAL LOW (ref 101–111)
Creatinine, Ser: 0.8 mg/dL (ref 0.44–1.00)
Glucose, Bld: 125 mg/dL — ABNORMAL HIGH (ref 65–99)
HEMATOCRIT: 37 % (ref 36.0–46.0)
HEMOGLOBIN: 12.6 g/dL (ref 12.0–15.0)
Potassium: 3.7 mmol/L (ref 3.5–5.1)
SODIUM: 132 mmol/L — AB (ref 135–145)
TCO2: 21 mmol/L (ref 0–100)

## 2014-09-12 LAB — I-STAT TROPONIN, ED: Troponin i, poc: 0.07 ng/mL (ref 0.00–0.08)

## 2014-09-12 LAB — MRSA PCR SCREENING: MRSA by PCR: NEGATIVE

## 2014-09-12 LAB — TROPONIN I
Troponin I: 3.49 ng/mL (ref <0.031)
Troponin I: 45.23 ng/mL (ref <0.031)
Troponin I: 44.5 ng/mL (ref <0.031)

## 2014-09-12 LAB — TSH: TSH: 4.45 u[IU]/mL (ref 0.350–4.500)

## 2014-09-12 LAB — BRAIN NATRIURETIC PEPTIDE: B NATRIURETIC PEPTIDE 5: 129.9 pg/mL — AB (ref 0.0–100.0)

## 2014-09-12 SURGERY — LEFT HEART CATH AND CORONARY ANGIOGRAPHY

## 2014-09-12 MED ORDER — ASPIRIN 300 MG RE SUPP: 300.0000 mg | RECTAL | Status: AC

## 2014-09-12 MED ORDER — CARVEDILOL 3.125 MG PO TABS
3.1250 mg | ORAL_TABLET | Freq: Two times a day (BID) | ORAL | Status: DC
  Administered 2014-09-12 – 2014-09-14 (×5): 3.125 mg via ORAL
  Filled 2014-09-12 (×5): qty 1

## 2014-09-12 MED ORDER — HEPARIN (PORCINE) IN NACL 2-0.9 UNIT/ML-% IJ SOLN
INTRAMUSCULAR | Status: AC
  Filled 2014-09-12: qty 1500

## 2014-09-12 MED ORDER — IOHEXOL 350 MG/ML SOLN
INTRAVENOUS | Status: DC | PRN
  Administered 2014-09-12: 145 mL via INTRA_ARTERIAL

## 2014-09-12 MED ORDER — FENTANYL CITRATE (PF) 100 MCG/2ML IJ SOLN
INTRAMUSCULAR | Status: AC
  Filled 2014-09-12: qty 4

## 2014-09-12 MED ORDER — HEPARIN SODIUM (PORCINE) 5000 UNIT/ML IJ SOLN
3000.0000 [IU] | Freq: Once | INTRAMUSCULAR | Status: DC
  Filled 2014-09-12: qty 1

## 2014-09-12 MED ORDER — SODIUM CHLORIDE 0.9 % IV SOLN: 250.0000 mL | INTRAVENOUS | Status: DC | PRN

## 2014-09-12 MED ORDER — NITROGLYCERIN 1 MG/10 ML FOR IR/CATH LAB
INTRA_ARTERIAL | Status: AC
  Filled 2014-09-12: qty 10

## 2014-09-12 MED ORDER — TICAGRELOR 90 MG PO TABS
ORAL_TABLET | ORAL | Status: DC | PRN
  Administered 2014-09-12: 180 mg via ORAL

## 2014-09-12 MED ORDER — BIVALIRUDIN BOLUS VIA INFUSION - CUPID
INTRAVENOUS | Status: DC | PRN
  Administered 2014-09-12: 39.825 mg via INTRAVENOUS

## 2014-09-12 MED ORDER — ASPIRIN 81 MG PO CHEW
81.0000 mg | CHEWABLE_TABLET | Freq: Every day | ORAL | Status: DC
  Administered 2014-09-12 – 2014-09-14 (×3): 81 mg via ORAL
  Filled 2014-09-12 (×2): qty 1

## 2014-09-12 MED ORDER — MIDAZOLAM HCL 2 MG/2ML IJ SOLN
INTRAMUSCULAR | Status: AC
  Filled 2014-09-12: qty 4

## 2014-09-12 MED ORDER — SODIUM CHLORIDE 0.9 % IV SOLN
INTRAVENOUS | Status: DC | PRN
  Administered 2014-09-12: 53 mL/h via INTRAVENOUS

## 2014-09-12 MED ORDER — RAMIPRIL 2.5 MG PO CAPS
2.5000 mg | ORAL_CAPSULE | Freq: Every day | ORAL | Status: DC
  Administered 2014-09-12 – 2014-09-14 (×3): 2.5 mg via ORAL
  Filled 2014-09-12 (×4): qty 1

## 2014-09-12 MED ORDER — ONDANSETRON HCL 4 MG/2ML IJ SOLN: 4.0000 mg | Freq: Four times a day (QID) | INTRAMUSCULAR | Status: DC | PRN

## 2014-09-12 MED ORDER — SODIUM CHLORIDE 0.9 % WEIGHT BASED INFUSION: 1.0000 mL/kg/h | INTRAVENOUS | Status: AC

## 2014-09-12 MED ORDER — ACETAMINOPHEN 325 MG PO TABS: 650.0000 mg | ORAL_TABLET | ORAL | Status: DC | PRN

## 2014-09-12 MED ORDER — NITROGLYCERIN 0.4 MG SL SUBL: 0.4000 mg | SUBLINGUAL_TABLET | SUBLINGUAL | Status: DC | PRN

## 2014-09-12 MED ORDER — NITROGLYCERIN 0.4 MG SL SUBL
SUBLINGUAL_TABLET | SUBLINGUAL | Status: AC
  Filled 2014-09-12: qty 1

## 2014-09-12 MED ORDER — BIVALIRUDIN 250 MG IV SOLR
INTRAVENOUS | Status: AC
  Filled 2014-09-12: qty 250

## 2014-09-12 MED ORDER — OXYCODONE-ACETAMINOPHEN 5-325 MG PO TABS: 1.0000 | ORAL_TABLET | ORAL | Status: DC | PRN

## 2014-09-12 MED ORDER — TICAGRELOR 90 MG PO TABS
90.0000 mg | ORAL_TABLET | Freq: Two times a day (BID) | ORAL | Status: DC
  Administered 2014-09-12 – 2014-09-14 (×5): 90 mg via ORAL
  Filled 2014-09-12 (×5): qty 1

## 2014-09-12 MED ORDER — FENTANYL CITRATE (PF) 100 MCG/2ML IJ SOLN
INTRAMUSCULAR | Status: DC | PRN
  Administered 2014-09-12: 25 ug via INTRAVENOUS

## 2014-09-12 MED ORDER — SODIUM CHLORIDE 0.9 % IJ SOLN
3.0000 mL | INTRAMUSCULAR | Status: DC | PRN
Start: 2014-09-12 — End: 2014-09-14
  Filled 2014-09-12: qty 3

## 2014-09-12 MED ORDER — SODIUM CHLORIDE 0.9 % IV SOLN
250.0000 mg | INTRAVENOUS | Status: DC | PRN
  Administered 2014-09-12: 1.75 mg/kg/h via INTRAVENOUS

## 2014-09-12 MED ORDER — ASPIRIN EC 81 MG PO TBEC: 81.0000 mg | DELAYED_RELEASE_TABLET | Freq: Every day | ORAL | Status: DC

## 2014-09-12 MED ORDER — NITROGLYCERIN IN D5W 200-5 MCG/ML-% IV SOLN: 10.0000 ug/min | INTRAVENOUS | Status: DC

## 2014-09-12 MED ORDER — VERAPAMIL HCL 2.5 MG/ML IV SOLN
INTRAVENOUS | Status: DC | PRN
  Administered 2014-09-12: 01:00:00 via INTRA_ARTERIAL

## 2014-09-12 MED ORDER — ENOXAPARIN SODIUM 40 MG/0.4ML ~~LOC~~ SOLN
40.0000 mg | SUBCUTANEOUS | Status: DC
  Administered 2014-09-13 – 2014-09-14 (×2): 40 mg via SUBCUTANEOUS
  Filled 2014-09-12 (×2): qty 0.4

## 2014-09-12 MED ORDER — ASPIRIN 81 MG PO CHEW: 324.0000 mg | CHEWABLE_TABLET | ORAL | Status: AC

## 2014-09-12 MED ORDER — HEPARIN SODIUM (PORCINE) 1000 UNIT/ML IJ SOLN
INTRAMUSCULAR | Status: DC | PRN
  Administered 2014-09-12: 3000 [IU] via INTRAVENOUS

## 2014-09-12 MED ORDER — LIDOCAINE HCL (PF) 1 % IJ SOLN
INTRAMUSCULAR | Status: AC
  Filled 2014-09-12: qty 30

## 2014-09-12 MED ORDER — MIDAZOLAM HCL 2 MG/2ML IJ SOLN
INTRAMUSCULAR | Status: DC | PRN
  Administered 2014-09-12: 1 mg via INTRAVENOUS

## 2014-09-12 MED ORDER — TICAGRELOR 90 MG PO TABS
ORAL_TABLET | ORAL | Status: AC
  Filled 2014-09-12: qty 2

## 2014-09-12 MED ORDER — INFLUENZA VAC SPLIT QUAD 0.5 ML IM SUSY
0.5000 mL | PREFILLED_SYRINGE | INTRAMUSCULAR | Status: AC
  Administered 2014-09-13: 0.5 mL via INTRAMUSCULAR
  Filled 2014-09-12 (×2): qty 0.5

## 2014-09-12 MED ORDER — SODIUM CHLORIDE 0.9 % IJ SOLN
3.0000 mL | Freq: Two times a day (BID) | INTRAMUSCULAR | Status: DC
  Administered 2014-09-12 – 2014-09-13 (×3): 3 mL via INTRAVENOUS

## 2014-09-12 MED ORDER — ATORVASTATIN CALCIUM 80 MG PO TABS
80.0000 mg | ORAL_TABLET | Freq: Every day | ORAL | Status: DC
  Administered 2014-09-12 – 2014-09-13 (×2): 80 mg via ORAL
  Filled 2014-09-12 (×2): qty 1

## 2014-09-12 SURGICAL SUPPLY — 13 items
BALLN TREK RX 2.5X12 (BALLOONS) ×3
BALLOON TREK RX 2.5X12 (BALLOONS) IMPLANT
CATH INFINITI JR4 5F (CATHETERS) ×2 IMPLANT
CATH VISTA GUIDE 6FR XBLAD3.0 (CATHETERS) ×2 IMPLANT
DEVICE RAD COMP TR BAND LRG (VASCULAR PRODUCTS) ×2 IMPLANT
GLIDESHEATH SLEND SS 6F .021 (SHEATH) ×2 IMPLANT
KIT ENCORE 26 ADVANTAGE (KITS) ×2 IMPLANT
KIT HEART LEFT (KITS) ×2 IMPLANT
PACK CARDIAC CATHETERIZATION (CUSTOM PROCEDURE TRAY) ×2 IMPLANT
STENT PROMUS PREM MR 3.0X20 (Permanent Stent) ×2 IMPLANT
WIRE ASAHI PROWATER 180CM (WIRE) ×2 IMPLANT
WIRE HI TORQ VERSACORE-J 145CM (WIRE) ×2 IMPLANT
WIRE SAFE-T 1.5MM-J .035X260CM (WIRE) ×2 IMPLANT

## 2014-09-12 NOTE — H&P (Signed)
History and Physical  Patient ID: Joanne Thompson MRN: [id number], DOB: 1930/07/19 Date of Encounter: 09/12/2014, 12:49 AM Primary Physician: No primary care provider on file. Primary Cardiologist: None.  Chief Complaint: Chest pain Reason for Admission: STEMI  HPI: 81 year old woman with self-reported history of a heart attack "over 40 years ago" but otherwise no significant past medical history, who presented with chest pain that began ~2 hours ago.  It began abruptly while she was watching televation.  She describes 7-8/10 left-sided chest tightness radiating to the left arm with associated shortness of breath.  She called 911 and was transported to the ED.  EKG in route was concerning for anterior STEMI, prompted STEMI alert.  The patient took ASA at home and received SL NTG with improvement in chest pain (currently 2-3/10).  Past Medical History  Diagnosis Date  . Degenerative arthritis of lumbar spine   . MI, old      Most Recent Cardiac Studies: None.   Surgical History:  Past Surgical History  Procedure Laterality Date  . Cardiac surgery      pericardial window  . Appendectomy       Home Meds: Prior to Admission medications   Medication Sig Start Date Marquell Saenz Date Taking? Authorizing Provider  aspirin 325 MG tablet Take 650 mg by mouth daily.    Historical Provider, MD  docusate sodium (COLACE) 100 MG capsule Take 1 capsule (100 mg total) by mouth every 12 (twelve) hours. 01/20/12   Quita Skye, MD  HYDROcodone-acetaminophen (NORCO/VICODIN) 5-325 MG per tablet 1-2 tablets po q 6 hours prn moderate to severe pain 01/20/12   Quita Skye, MD    Allergies: No Known Allergies  Social History   Social History  . Marital Status: Widowed    Spouse Name: N/A  . Number of Children: N/A  . Years of Education: N/A   Occupational History  . Not on file.   Social History Main Topics  . Smoking status: Current Every Day Smoker -- 0.50 packs/day    Types: Cigarettes  .  Smokeless tobacco: Never Used  . Alcohol Use: No  . Drug Use: No  . Sexual Activity: No   Other Topics Concern  . Not on file   Social History Narrative     No family history on file.  Review of Systems: A 12-system review of systems was performed and was negative, except as noted in the HPI.  Labs:   Lab Results  Component Value Date   WBC 10.0 09/12/2014   HGB 11.6* 09/12/2014   HCT 35.8* 09/12/2014   MCV 91.1 09/12/2014   PLT 193 09/12/2014   No results for input(s): NA, K, CL, CO2, BUN, CREATININE, CALCIUM, PROT, BILITOT, ALKPHOS, ALT, AST, GLUCOSE in the last 168 hours.  Invalid input(s): LABALBU No results for input(s): CKTOTAL, CKMB, TROPONINI in the last 72 hours. No results found for: CHOL, HDL, LDLCALC, TRIG No results found for: DDIMER  Radiology/Studies:  No results found. Wt Readings from Last 3 Encounters:  09/12/14 53.071 kg (117 lb)    EKG: NSR with RBBB and 0.5-2 mm ST segment elevation in V1-V3.  Physical Exam: Blood pressure 129/56, pulse 72, temperature 98.5 F (36.9 C), temperature source Oral, resp. rate 22, height  (1.676 m), weight 53.071 kg (117 lb), SpO2 100 %. General: Well developed, well nourished, elderly woman in no acute distress. Head: Normocephalic, atraumatic, sclera non-icteric, no xanthomas, nares are without discharge.  Neck: Negative for carotid bruits. JVD  not elevated. Lungs: Clear bilaterally to auscultation without wheezes, rales, or rhonchi. Breathing is unlabored. Heart: RRR with S1 S2. No murmurs, rubs, or gallops appreciated. Abdomen: Soft, non-tender, non-distended with normoactive bowel sounds. No hepatomegaly. No rebound/guarding. No obvious abdominal masses. Msk:  Strength and tone appear normal for age. Extremities: No clubbing or cyanosis. No edema.  Distal pedal pulses are 2+ and equal bilaterally.  2+ radial and femoral pulses bilaterally. Neuro: Alert and oriented X 3. No focal deficit. No facial  asymmetry. Moves all extremities spontaneously. Psych:  Responds to questions appropriately with a normal affect.    ASSESSMENT AND PLAN:  81 y/o woman with reported history of very remote MI, who presents via EMS with acute onset of chest pain and EKG showing subtle anterior ST-segment elevation concerning for anterior STEMI.  The case has been discussed with Dr. Katrinka Blazing, and we will plan to proceed with emergent coronary angiography. - Emergent LHC - Patient received ASA prior to arrival.  Further antiplatelet therapy based on cath results. - Risk stratify with lipid panel and hemoglobin A1c in AM  Signed, Laetitia Schnepf MD 09/12/2014, 12:49 AM Pager: [phone]

## 2014-09-12 NOTE — Progress Notes (Signed)
CARDIAC REHAB PHASE I   PRE:  Rate/Rhythm: Sinus Brady 58  BP:  Supine: 158/67      SaO2: 100% room Air  MODE:  Ambulation: 350 ft   POST:  Rate/Rhythem: Sinus 71  BP:    Sitting: 156/77    1225-1345 Patient tolerated ambulation in the hallway without difficulty.  Education completed on heart healthy low sodium diet. Exercise instructions reviewed. Joanne Thompson is not interested in phase 2 cardiac rehab at this time.  Mi book and smoking cessation given to patient. Joanne Thompson was given the phone number to 1-800 quit now. Sublingual nitroglycerin , Brillinta discussed with patient. Patient has stent card.   Whitaker, Joanne Bruce RN BSn

## 2014-09-12 NOTE — ED Notes (Signed)
Dr. Mora Bellman requests radio pads to be placed on pt.

## 2014-09-12 NOTE — ED Notes (Addendum)
Dr. Mora Bellman requests to call code STEMI

## 2014-09-12 NOTE — ED Provider Notes (Signed)
CSN: [id number]     Arrival date & time 09/11/14  2352 History  This chart was scribed for Tomasita Crumble, MD by Phillis Haggis, ED Scribe. This patient was seen in room A07C/A07C and patient care was started at 12:00 AM.    Chief Complaint  Patient presents with  . Chest Pain   The history is provided by the patient and the EMS personnel. No language interpreter was used.    HPI Comments: Joanne Thompson is a 81 y.o. female who presents to the Emergency Department complaining of left sided, sharp, sudden onset chest pain, rated 10/10, onset PTA. Per EMS, pt began to experience chest pain and took aspirin 325 mg, then called EMS 45 minutes later after she began to have pain radiate down her left arm. EMS states that she was given Nitro PTA and pt now reports pain is 1/10. Pt reports associated SOB and states that the pain feels similar to her past heart attack. She denies knowledge of having a LBBB or heart murmur, vomiting, or fever. EMS denies that pt has significant hx of heart problems except for an open heart surgery that she does not know the reason for. Pt does not have a regular cardiologist.   Past Medical History  Diagnosis Date  . Degenerative arthritis of lumbar spine    Past Surgical History  Procedure Laterality Date  . Cardiac surgery      pericardial window  . Appendectomy     No family history on file. Social History  Substance Use Topics  . Smoking status: Current Every Day Smoker -- 0.50 packs/day    Types: Cigarettes  . Smokeless tobacco: Never Used  . Alcohol Use: No   OB History    No data available     Review of Systems 10 Systems reviewed and all are negative for acute change except as noted in the HPI.  Allergies  Review of patient's allergies indicates no known allergies.  Home Medications   Prior to Admission medications   Medication Sig Start Date End Date Taking? Authorizing Provider  aspirin 325 MG tablet Take 650 mg by mouth daily.     Historical Provider, MD  docusate sodium (COLACE) 100 MG capsule Take 1 capsule (100 mg total) by mouth every 12 (twelve) hours. 01/20/12   Quita Skye, MD  HYDROcodone-acetaminophen (NORCO/VICODIN) 5-325 MG per tablet 1-2 tablets po q 6 hours prn moderate to severe pain 01/20/12   Quita Skye, MD   SpO2 100%   Physical Exam  Constitutional: She is oriented to person, place, and time. She appears well-developed and well-nourished. No distress.  HENT:  Head: Normocephalic and atraumatic.  Nose: Nose normal.  Mouth/Throat: Oropharynx is clear and moist. No oropharyngeal exudate.  Eyes: Conjunctivae and EOM are normal. Pupils are equal, round, and reactive to light. No scleral icterus.  Neck: Normal range of motion. Neck supple. No JVD present. No tracheal deviation present. No thyromegaly present.  Cardiovascular: Normal rate and regular rhythm.  Exam reveals no gallop and no friction rub.   Murmur heard. Pulmonary/Chest: Effort normal and breath sounds normal. No respiratory distress. She has no wheezes. She exhibits no tenderness.  Abdominal: Soft. Bowel sounds are normal. She exhibits no distension and no mass. There is no tenderness. There is no rebound and no guarding.  Musculoskeletal: Normal range of motion. She exhibits no edema or tenderness.  Lymphadenopathy:    She has no cervical adenopathy.  Neurological: She is alert and oriented to person, place,  and time. No cranial nerve deficit. She exhibits normal muscle tone.  Skin: Skin is warm and dry. No rash noted. No erythema. No pallor.  Nursing note and vitals reviewed.   ED Course  Procedures (including critical care time) DIAGNOSTIC STUDIES: Oxygen Saturation is 100% on RA, normal by my interpretation.    COORDINATION OF CARE: 12:04 AM-Discussed treatment plan which includes call to cardiologist and EKG with pt at bedside and pt agreed to plan.   Labs Review Labs Reviewed  CBC WITH DIFFERENTIAL/PLATELET - Abnormal; Notable  for the following:    Hemoglobin 11.6 (*)    HCT 35.8 (*)    Neutrophils Relative % 89 (*)    Neutro Abs 8.9 (*)    Lymphocytes Relative 6 (*)    Lymphs Abs 0.6 (*)    All other components within normal limits  COMPREHENSIVE METABOLIC PANEL  I-STAT TROPOININ, ED    Imaging Review No results found.    EKG Interpretation   Date/Time:  Saturday September 12 2014 00:04:11 EDT Ventricular Rate:  65 PR Interval:  169 QRS Duration: 148 QT Interval:  455 QTC Calculation: 473 R Axis:   84 Text Interpretation:  Sinus rhythm Right bundle branch block ** ** ACUTE  MI / STEMI ** ** Confirmed by Erroll Luna [phone]) on 09/12/2014  12:25:06 AM      MDM   Final diagnoses:  None   EKG obtained by EMS was reviewed. I have concerns for ST elevation. Code STEMI was called and the cath team has been activated. I spoke with Dr. Okey Dupre the cardiology fellow who agrees with assessment. Patient will be taken to the Cath Lab for further treatment. Patient already received aspirin at home. She received 3 nitroglycerin tablets by EMS. I ordered heparin to be given.  I personally performed the services described in this documentation, which was scribed in my presence. The recorded information has been reviewed and is accurate.   Tomasita Crumble, MD 09/12/14 1524

## 2014-09-12 NOTE — ED Notes (Signed)
Code Stemi paged [phone].

## 2014-09-12 NOTE — Progress Notes (Signed)
eLink Physician-Brief Progress Note Patient Name: Joanne Thompson DOB: May 06, 1930 MRN: [id number]   Date of Service  09/12/2014  HPI/Events of Note  Patient is 23 admitted for chest pain and findings of STEMI - anterior MI.  She underwent CC and had PCI/stent to LAD.  Currently the patient is HD stable with HR of 60 and sats of 100%.  She is in no resp distress.  eICU Interventions  Plan of care per primary cardiology team Continue to monitor via [facility]     Intervention Category Evaluation Type: New Patient Evaluation  Joanne Thompson 09/12/2014, 2:58 [id number]

## 2014-09-12 NOTE — ED Notes (Signed)
W/ the pts permission, just returned Nota Bew (son) phone call [phone].  He reported that he believes that she is having heart burn due to the new spices she added to the dinner tonight.  He asked if he could come to the ED, RN suggested that it would be a good idea.  He is on his way.

## 2014-09-12 NOTE — Progress Notes (Signed)
Patient ID: LANIYA MAZAK, female   DOB: [birth year], 81 y.o.   MRN: [id number]    Patient Name: Joanne Thompson Date of Encounter: 09/12/2014     Active Problems:   STEMI (ST elevation myocardial infarction)   ST elevation (STEMI) myocardial infarction involving left anterior descending coronary artery    SUBJECTIVE  Denies chest pain or sob.   CURRENT MEDS . aspirin  324 mg Oral NOW   Or  . aspirin  300 mg Rectal NOW  . aspirin  81 mg Oral Daily  . atorvastatin  80 mg Oral q1800  . [START ON 09/13/2014] enoxaparin (LOVENOX) injection  40 mg Subcutaneous Q24H  . heparin  3,000 Units Intravenous Once  . sodium chloride  3 mL Intravenous Q12H  . ticagrelor  90 mg Oral BID    OBJECTIVE  Filed Vitals:   09/12/14 0600 09/12/14 0630 09/12/14 0700 09/12/14 0728  BP: 130/63 102/55 98/50   Pulse: 64 50 49   Temp:    98.5 F (36.9 C)  TempSrc:    Oral  Resp: 16 15 14    Height:      Weight:      SpO2: 100% 100% 100%     Intake/Output Summary (Last 24 hours) at 09/12/14 0823 Last data filed at 09/12/14 0500  Gross per 24 hour  Intake  350.6 ml  Output    425 ml  Net  -74.4 ml   Filed Weights   09/12/14 0002 09/12/14 0151  Weight: 117 lb (53.071 kg) 102 lb 15.3 oz (46.7 kg)    PHYSICAL EXAM  General: Pleasant, elderly woman, NAD. Neuro: Alert and oriented X 3. Moves all extremities spontaneously. HEENT:  Normal  Neck: Supple without bruits or JVD. Lungs:  Resp regular and unlabored, CTA. Heart: RRR no s3, s4, or murmurs. Abdomen: Soft, non-tender, non-distended, BS + x 4.  Extremities: No clubbing, cyanosis or edema. DP/PT/Radials 2+ and equal bilaterally. Right wrist with minimal ecchymosis  Accessory Clinical Findings  CBC  Recent Labs  09/12/14 0012 09/12/14 0225  WBC 10.0 9.3  NEUTROABS 8.9*  --   HGB 11.6* 11.9*  HCT 35.8* 36.6  MCV 91.1 90.6  PLT 193 182   Basic Metabolic Panel  Recent Labs  09/12/14 0012 09/12/14 0225  NA 134* 134*   K 5.0 4.1  CL 102 102  CO2 24 23  GLUCOSE 146* 129*  BUN 25* 23*  CREATININE 1.09* 0.89  CALCIUM 8.0* 7.9*   Liver Function Tests  Recent Labs  09/12/14 0012  AST 37  ALT 18  ALKPHOS 90  BILITOT 1.3*  PROT 6.1*  ALBUMIN 3.3*   No results for input(s): LIPASE, AMYLASE in the last 72 hours. Cardiac Enzymes  Recent Labs  09/12/14 0225  TROPONINI 3.49*   BNP Invalid input(s): POCBNP D-Dimer No results for input(s): DDIMER in the last 72 hours. Hemoglobin A1C No results for input(s): HGBA1C in the last 72 hours. Fasting Lipid Panel  Recent Labs  09/12/14 0400  CHOL 129  HDL 38*  LDLCALC 84  TRIG 35  CHOLHDL 3.4   Thyroid Function Tests  Recent Labs  09/12/14 0225  TSH 4.450    TELE  nsr  ECG  NSR with IRBBB  Radiology/Studies  Dg Chest Port 1 View  09/12/2014   CLINICAL DATA:  Left-sided and central chest pain.  EXAM: PORTABLE CHEST - 1 VIEW  COMPARISON:  Frontal and lateral views 06/13/2010  FINDINGS: Probable volume loss in left hemithorax  with ill-defined opacity in the left suprahilar region. The previous scarring at the left lung apex is partially obscured. The lungs remain hyperinflated. Cardiomediastinal contours are unchanged with atherosclerosis of the thoracic aorta. There is no pulmonary edema. No pneumothorax or large pleural effusion. Hyperdensity projecting over both proximal humeri.  IMPRESSION: 1. Hyperinflation. While there are is a history of left apical scarring, there is progressive volume loss in the left hemithorax and ill-defined left suprahilar opacity. This may be related to differences in technique and positioning, however chest CT with contrast is recommended to exclude underlying mass lesion. 2. Hyperdensities projecting over both proximal humeri. It is unclear whether this represents vascular calcifications versus true bone abnormalities. Nonemergent humeral radiographs recommended.   Electronically Signed   By: Rubye Oaks M.D.   On: 09/12/2014 01:19    ASSESSMENT AND PLAN  1. Acute anterior MI 2. S/p PCI stenting of LAD with nice result Rec: observe in unit today, transfer to tele tomorrow, home Monday if stable. Continue current meds. Will add low dose ACE inhibitor and beta blocker. She has exhibited no complications so far from her MI.  Ola Raap,M.D.  09/12/2014 8:23 AM

## 2014-09-12 NOTE — ED Notes (Addendum)
Cardiology at bedside.

## 2014-09-12 NOTE — Progress Notes (Signed)
Chaplain responded to call to assist son of pt. When he presented in waiting room.   I escorted him to the cath lab waiting room, then waited with him until MD gave him good report. Son was tearful, having been very concerned that his mother was going to die from this incident.  I escorted pt.'s son to the 2H waiting room, gave him instructions of what to expect, and notified the staff of his location.  Rev. [place], [place] [phone]

## 2014-09-12 NOTE — Progress Notes (Signed)
Patient troponin resulted positive 3.49, had also episode of 16 beat run of v-tach while patient asleep.  SBP 90's-110's. Patient denies any symptoms or pain Dr. Okey Dupre made aware will continue to monitor patient.

## 2014-09-12 NOTE — ED Notes (Addendum)
Pt given 1 nitro, brought pain down to a 7/10. Pt then given 4 mg morphine, pain went down to 2/10.

## 2014-09-13 DIAGNOSIS — I2102 ST elevation (STEMI) myocardial infarction involving left anterior descending coronary artery: Secondary | ICD-10-CM | POA: Diagnosis not present

## 2014-09-13 NOTE — Progress Notes (Signed)
Patient ID: TIMEKIA UPSON, female   DOB: [birth year], 81 y.o.   MRN: [id number]    Patient Name: Joanne Thompson Date of Encounter: 09/13/2014     Active Problems:   STEMI (ST elevation myocardial infarction)   ST elevation (STEMI) myocardial infarction involving left anterior descending coronary artery    SUBJECTIVE  No chest pain or sob. "I feel a little weak"  CURRENT MEDS . aspirin  81 mg Oral Daily  . atorvastatin  80 mg Oral q1800  . carvedilol  3.125 mg Oral BID WC  . enoxaparin (LOVENOX) injection  40 mg Subcutaneous Q24H  . heparin  3,000 Units Intravenous Once  . Influenza vac split quadrivalent PF  0.5 mL Intramuscular Tomorrow-1000  . ramipril  2.5 mg Oral Daily  . sodium chloride  3 mL Intravenous Q12H  . ticagrelor  90 mg Oral BID    OBJECTIVE  Filed Vitals:   09/13/14 0400 09/13/14 0500 09/13/14 0600 09/13/14 0742  BP: 134/97 115/43 129/47 142/63  Pulse:      Temp:    97.7 F (36.5 C)  TempSrc:    Oral  Resp:    18  Height:      Weight:      SpO2:    97%    Intake/Output Summary (Last 24 hours) at 09/13/14 0746 Last data filed at 09/13/14 0700  Gross per 24 hour  Intake    560 ml  Output    625 ml  Net    -65 ml   Filed Weights   09/12/14 0002 09/12/14 0151  Weight: 117 lb (53.071 kg) 102 lb 15.3 oz (46.7 kg)    PHYSICAL EXAM  General: Pleasant, NAD. Neuro: Alert and oriented X 3. Moves all extremities spontaneously. Psych: Normal affect. HEENT:  Normal  Neck: Supple without bruits or JVD. Lungs:  Resp regular and unlabored, CTA. Heart: RRR no s3, s4, or murmurs. Abdomen: Soft, non-tender, non-distended, BS + x 4.  Extremities: No clubbing, cyanosis or edema. DP/PT/Radials 2+ and equal bilaterally.  Accessory Clinical Findings  CBC  Recent Labs  09/12/14 0012  09/12/14 0225 09/12/14 0847  WBC 10.0  --  9.3 7.5  NEUTROABS 8.9*  --   --   --   HGB 11.6*  < > 11.9* 14.1  HCT 35.8*  < > 36.6 43.1  MCV 91.1  --  90.6 92.1    PLT 193  --  182 167  < > = values in this interval not displayed. Basic Metabolic Panel  Recent Labs  09/12/14 0225 09/12/14 0847  NA 134* 136  K 4.1 4.6  CL 102 105  CO2 23 23  GLUCOSE 129* 134*  BUN 23* 21*  CREATININE 0.89 0.87  CALCIUM 7.9* 8.4*   Liver Function Tests  Recent Labs  09/12/14 0012  AST 37  ALT 18  ALKPHOS 90  BILITOT 1.3*  PROT 6.1*  ALBUMIN 3.3*   No results for input(s): LIPASE, AMYLASE in the last 72 hours. Cardiac Enzymes  Recent Labs  09/12/14 0225 09/12/14 0847 09/12/14 1507  TROPONINI 3.49* 45.23* 44.50*   BNP Invalid input(s): POCBNP D-Dimer No results for input(s): DDIMER in the last 72 hours. Hemoglobin A1C No results for input(s): HGBA1C in the last 72 hours. Fasting Lipid Panel  Recent Labs  09/12/14 0400  CHOL 129  HDL 38*  LDLCALC 84  TRIG 35  CHOLHDL 3.4   Thyroid Function Tests  Recent Labs  09/12/14 0225  TSH 4.450  TELE  nsr  Radiology/Studies  Dg Chest Port 1 View  09/12/2014   CLINICAL DATA:  Left-sided and central chest pain.  EXAM: PORTABLE CHEST - 1 VIEW  COMPARISON:  Frontal and lateral views 06/13/2010  FINDINGS: Probable volume loss in left hemithorax with ill-defined opacity in the left suprahilar region. The previous scarring at the left lung apex is partially obscured. The lungs remain hyperinflated. Cardiomediastinal contours are unchanged with atherosclerosis of the thoracic aorta. There is no pulmonary edema. No pneumothorax or large pleural effusion. Hyperdensity projecting over both proximal humeri.  IMPRESSION: 1. Hyperinflation. While there are is a history of left apical scarring, there is progressive volume loss in the left hemithorax and ill-defined left suprahilar opacity. This may be related to differences in technique and positioning, however chest CT with contrast is recommended to exclude underlying mass lesion. 2. Hyperdensities projecting over both proximal humeri. It is  unclear whether this represents vascular calcifications versus true bone abnormalities. Nonemergent humeral radiographs recommended.   Electronically Signed   By: Rubye Oaks M.D.   On: 09/12/2014 01:19    ASSESSMENT AND PLAN  1. Acute MI 2. S/p PCI Rec: the patient continues to do well. Cardiac rehab has started. Will transfer to floor with plans to discharge home tomorrow if no other issues.   Evert Wenrich,M.D.  09/13/2014 7:46 AM

## 2014-09-14 ENCOUNTER — Inpatient Hospital Stay (HOSPITAL_COMMUNITY): Payer: Medicare Other

## 2014-09-14 ENCOUNTER — Telehealth: Payer: Self-pay | Admitting: Cardiology

## 2014-09-14 ENCOUNTER — Encounter (HOSPITAL_COMMUNITY): Payer: Self-pay | Admitting: Interventional Cardiology

## 2014-09-14 DIAGNOSIS — R011 Cardiac murmur, unspecified: Secondary | ICD-10-CM

## 2014-09-14 DIAGNOSIS — I4729 Other ventricular tachycardia: Secondary | ICD-10-CM

## 2014-09-14 DIAGNOSIS — I472 Ventricular tachycardia: Secondary | ICD-10-CM

## 2014-09-14 DIAGNOSIS — I110 Hypertensive heart disease with heart failure: Secondary | ICD-10-CM

## 2014-09-14 DIAGNOSIS — I2510 Atherosclerotic heart disease of native coronary artery without angina pectoris: Secondary | ICD-10-CM

## 2014-09-14 DIAGNOSIS — F0390 Unspecified dementia without behavioral disturbance: Secondary | ICD-10-CM | POA: Diagnosis present

## 2014-09-14 DIAGNOSIS — I350 Nonrheumatic aortic (valve) stenosis: Secondary | ICD-10-CM

## 2014-09-14 DIAGNOSIS — I255 Ischemic cardiomyopathy: Secondary | ICD-10-CM

## 2014-09-14 DIAGNOSIS — Z9861 Coronary angioplasty status: Secondary | ICD-10-CM

## 2014-09-14 DIAGNOSIS — R739 Hyperglycemia, unspecified: Secondary | ICD-10-CM

## 2014-09-14 HISTORY — PX: TRANSTHORACIC ECHOCARDIOGRAM: SHX275

## 2014-09-14 LAB — BASIC METABOLIC PANEL
ANION GAP: 6 (ref 5–15)
BUN: 20 mg/dL (ref 6–20)
CALCIUM: 8.2 mg/dL — AB (ref 8.9–10.3)
CO2: 24 mmol/L (ref 22–32)
Chloride: 106 mmol/L (ref 101–111)
Creatinine, Ser: 0.84 mg/dL (ref 0.44–1.00)
GFR calc Af Amer: >60 mL/min (ref >60)
GLUCOSE: 136 mg/dL — AB (ref 65–99)
Potassium: 4.2 mmol/L (ref 3.5–5.1)
Sodium: 136 mmol/L (ref 135–145)

## 2014-09-14 LAB — CBC WITH DIFFERENTIAL/PLATELET
BASOS PCT: 0 % (ref 0–1)
Basophils Absolute: 0 10*3/uL (ref 0.0–0.1)
EOS PCT: 0 % (ref 0–5)
Eosinophils Absolute: 0 10*3/uL (ref 0.0–0.7)
HEMATOCRIT: 39.9 % (ref 36.0–46.0)
Hemoglobin: 12.9 g/dL (ref 12.0–15.0)
Lymphocytes Relative: 12 % (ref 12–46)
Lymphs Abs: 0.9 10*3/uL (ref 0.7–4.0)
MCH: 29.2 pg (ref 26.0–34.0)
MCHC: 32.3 g/dL (ref 30.0–36.0)
MCV: 90.3 fL (ref 78.0–100.0)
MONO ABS: 0.6 10*3/uL (ref 0.1–1.0)
MONOS PCT: 8 % (ref 3–12)
NEUTROS ABS: 6.5 10*3/uL (ref 1.7–7.7)
Neutrophils Relative %: 80 % — ABNORMAL HIGH (ref 43–77)
PLATELETS: 186 10*3/uL (ref 150–400)
RBC: 4.42 MIL/uL (ref 3.87–5.11)
RDW: 14.1 % (ref 11.5–15.5)
WBC: 8 10*3/uL (ref 4.0–10.5)

## 2014-09-14 LAB — HEMOGLOBIN A1C
HEMOGLOBIN A1C: 6.5 % — AB (ref 4.8–5.6)
MEAN PLASMA GLUCOSE: 140 mg/dL

## 2014-09-14 LAB — POCT ACTIVATED CLOTTING TIME
ACTIVATED CLOTTING TIME: 786 s
Activated Clotting Time: 196 seconds

## 2014-09-14 MED ORDER — ASPIRIN EC 81 MG PO TBEC: 81.0000 mg | DELAYED_RELEASE_TABLET | Freq: Every day | ORAL | Status: DC

## 2014-09-14 MED ORDER — ATORVASTATIN CALCIUM 80 MG PO TABS: 80.0000 mg | ORAL_TABLET | Freq: Every evening | ORAL | Status: DC

## 2014-09-14 MED ORDER — NITROGLYCERIN 0.4 MG SL SUBL: 0.4000 mg | SUBLINGUAL_TABLET | SUBLINGUAL | Status: AC | PRN

## 2014-09-14 MED ORDER — RAMIPRIL 2.5 MG PO CAPS: 2.5000 mg | ORAL_CAPSULE | Freq: Every day | ORAL | Status: DC

## 2014-09-14 MED ORDER — TICAGRELOR 90 MG PO TABS: 90.0000 mg | ORAL_TABLET | Freq: Two times a day (BID) | ORAL | Status: DC

## 2014-09-14 MED ORDER — ASPIRIN 81 MG PO TBEC: 81.0000 mg | DELAYED_RELEASE_TABLET | Freq: Every day | ORAL | Status: AC

## 2014-09-14 MED ORDER — ENOXAPARIN SODIUM 30 MG/0.3ML ~~LOC~~ SOLN: 30.0000 mg | SUBCUTANEOUS | Status: DC

## 2014-09-14 MED ORDER — CARVEDILOL 3.125 MG PO TABS: 3.1250 mg | ORAL_TABLET | Freq: Two times a day (BID) | ORAL | Status: DC

## 2014-09-14 MED FILL — Lidocaine HCl Local Preservative Free (PF) Inj 1%: INTRAMUSCULAR | Qty: 30 | Status: AC

## 2014-09-14 MED FILL — Heparin Sodium (Porcine) 2 Unit/ML in Sodium Chloride 0.9%: INTRAMUSCULAR | Qty: 1500 | Status: AC

## 2014-09-14 MED FILL — Nitroglycerin IV Soln 100 MCG/ML in D5W: INTRA_ARTERIAL | Qty: 10 | Status: AC

## 2014-09-14 NOTE — Progress Notes (Signed)
CARDIAC REHAB PHASE I   PRE:  Rate/Rhythm: 59 SB  BP:  Sitting: 133/72        SaO2: 99 RA  MODE:  Ambulation: 200 ft   POST:  Rate/Rhythm: 74 SR  BP:  Sitting: 144/93         SaO2: 99 RA  Pt ambulated 200 ft on RA, handheld assist, mildly unsteady gait (pt states she feels "wobbly from being in bed for days", tolerated fair.  Pt c/o of mild fatigue, denies cp, dizziness, DOE, declined rest stop. Per note MI/stent education completed with pt on Saturday. When pt asked to retrieve education materials, perform teach back, pt unable to recall location of materials (MI book, stent card, diet, exercise guidelines).  Upon further discussion, pt unaware that stent placed, pt oriented to self, can state she is in the hospital, unsure which hospital, unable to states the month, states year as 2004. Pt reports having trouble with her memory, states she lives with her son. Spoke with RN and 2H RN (where pt transferred from), unable to locate education materials/stent card. Phoned pt son, states he is not aware of any stent card and did not take anything home. Pt son plans to pick pt up when she is discharged but states he is unable to come any earlier to discuss education. MI/stent education completed with son over phone.  Reviewed tobacco cessation (gave pt fake cigarette), risk factors, anti-platelet therapy, stent card (unalbe to locate-RN aware), activity restrictions, ntg, exercise, heart healthy diet and phase 2 cardiac rehab. Pt's son, [name], verbalized understanding. Pt declines phase 2 cardiac rehab, states she is not interested, left brochure. Left education material at bedside along with brilinta card. RN aware. Pt to bed after walk, bed alarm on, call bell within reach.     [phone]   Joylene Grapes, RN, BSN 09/14/2014 10:57 AM

## 2014-09-14 NOTE — Progress Notes (Signed)
Patient: Joanne Thompson / Admit Date: 09/11/2014 / Date of Encounter: 09/14/2014, 8:27 AM   Subjective: Feels good. No complaints. No CP or SOB.  Objective: Telemetry: NSR occasional PVCs Physical Exam: Blood pressure 127/63, pulse 68, temperature 99.1 F (37.3 C), temperature source Oral, resp. rate 16, height 5\' 6"  (1.676 m), weight 97 lb 4.8 oz (44.135 kg), SpO2 98 %. General: Well developed thin WF in no acute distress. Head: Normocephalic, atraumatic, sclera non-icteric, no xanthomas, nares are without discharge. Neck: JVP not elevated. Lungs: Clear bilaterally to auscultation without wheezes, rales, or rhonchi. Breathing is unlabored. Heart: RRR S1 S2  2/6 SEM at RUSB, less pronounced at LSB. No rubs or gallops.  Abdomen: Soft, non-tender, non-distended with normoactive bowel sounds. No rebound/guarding. Extremities: No clubbing or cyanosis. No edema. Distal pedal pulses are 2+ and equal bilaterally. Right radial cath site without hematoma or ecchymosis; good pulse Neuro: Alert and oriented X 3. Moves all extremities spontaneously. Psych:  Responds to questions appropriately with a normal affect.   Intake/Output Summary (Last 24 hours) at 09/14/14 0827 Last data filed at 09/13/14 2010  Gross per 24 hour  Intake    366 ml  Output      0 ml  Net    366 ml    Inpatient Medications:  . aspirin  81 mg Oral Daily  . atorvastatin  80 mg Oral q1800  . carvedilol  3.125 mg Oral BID WC  . enoxaparin (LOVENOX) injection  40 mg Subcutaneous Q24H  . heparin  3,000 Units Intravenous Once  . ramipril  2.5 mg Oral Daily  . sodium chloride  3 mL Intravenous Q12H  . ticagrelor  90 mg Oral BID   Infusions:  . nitroGLYCERIN      Labs:  Recent Labs  09/12/14 0847 09/14/14 0535  NA 136 136  K 4.6 4.2  CL 105 106  CO2 23 24  GLUCOSE 134* 136*  BUN 21* 20  CREATININE 0.87 0.84  CALCIUM 8.4* 8.2*    Recent Labs  09/12/14 0012  AST 37  ALT 18  ALKPHOS 90  BILITOT 1.3*    PROT 6.1*  ALBUMIN 3.3*    Recent Labs  09/12/14 0012  09/12/14 0847 09/14/14 0736  WBC 10.0  < > 7.5 8.0  NEUTROABS 8.9*  --   --  6.5  HGB 11.6*  < > 14.1 12.9  HCT 35.8*  < > 43.1 39.9  MCV 91.1  < > 92.1 90.3  PLT 193  < > 167 186  < > = values in this interval not displayed.  Recent Labs  09/12/14 0225 09/12/14 0847 09/12/14 1507  TROPONINI 3.49* 45.23* 44.50*   Invalid input(s): POCBNP No results for input(s): HGBA1C in the last 72 hours.   Radiology/Studies:  Dg Chest Port 1 View  09/12/2014   CLINICAL DATA:  Left-sided and central chest pain.  EXAM: PORTABLE CHEST - 1 VIEW  COMPARISON:  Frontal and lateral views 06/13/2010  FINDINGS: Probable volume loss in left hemithorax with ill-defined opacity in the left suprahilar region. The previous scarring at the left lung apex is partially obscured. The lungs remain hyperinflated. Cardiomediastinal contours are unchanged with atherosclerosis of the thoracic aorta. There is no pulmonary edema. No pneumothorax or large pleural effusion. Hyperdensity projecting over both proximal humeri.  IMPRESSION: 1. Hyperinflation. While there are is a history of left apical scarring, there is progressive volume loss in the left hemithorax and ill-defined left suprahilar opacity. This may be  related to differences in technique and positioning, however chest CT with contrast is recommended to exclude underlying mass lesion. 2. Hyperdensities projecting over both proximal humeri. It is unclear whether this represents vascular calcifications versus true bone abnormalities. Nonemergent humeral radiographs recommended.   Electronically Signed   By: Rubye Oaks M.D.   On: 09/12/2014 01:19     Assessment and Plan   1. Anterior STEMI/CAD s/p DES to LAD; chronic total occlusion of mRCA with R-R/L-R collaterals, moderate Cx disease 2. Mild ICM EF 50% with inferior/apical mild HK 3. Hyperglycemia 4. Systolic murmur 5. Essential HTN - BP labile   6. One run of NSVT on 09/12/14, possibly 2/2 reperfusion  Feeling well post-PCI. No sx. Continue current regimen. Will await echo for systolic murmur on exam. Have also asked nursing to alert cardiac rehab team to see - they saw her on 8/27 and she declined phase II. Needs to f/u PCP for hyperglycemia (no recent A1C). Anticipate D/c later this afternoon if echo is OK. Will need TOC 7 day f/u and 30-day Brilinta RX along with usual RX.  Addendum: CXR results reviewed. Will review with Dr. Tresa Endo to determine what further workup needs to occur inpatient. "IMPRESSION:1. Hyperinflation. While there are is a history of left apicalscarring, there is progressive volume loss in the left hemithorax and ill-defined left suprahilar opacity. This may be related to differences in technique and positioning, however chest CT with contrast is recommended to exclude underlying mass lesion. 2. Hyperdensities projecting over both proximal humeri. It is unclear whether this represents vascular calcifications versus true bone abnormalities. Nonemergent humeral radiographs recommended."  Signed, Ronie Spies PA-C Pager: [phone]   Patient seen and examined. Agree with assessment and plan. S/P acute PCI to LAD. Echo reveals EF 40 - 45%, mild LVH, mid inferior hypokinesis. Mild AS. Medical therapy for concomitant CAD with occluded RCA with collaterals, and LCX disease. Titrate ACE-I as outpatient. Probably can dc today;  F/u CXR/ ?chest CT as outpatient.  Lennette Bihari, MD, [facility] 09/14/2014 1:36 PM

## 2014-09-14 NOTE — Care Management Important Message (Signed)
Important Message  Patient Details  Name: Joanne Thompson MRN: [id number] Date of Birth: [birth year]   Medicare Important Message Given:  Yes-second notification given    [name] 09/14/2014, 3:43 PM

## 2014-09-14 NOTE — Care Management Note (Signed)
Case Management Note  Patient Details  Name: Joanne Thompson MRN: [id number] Date of Birth: May 24, 1930  Subjective/Objective:     Pt plan for d/c today. Post cath. Initiated on Brilinta.                Action/Plan: Pt is without Rx drug coverage. Pt has 30 day free card. Patient Assistance forms left on shadow chart for MD to fill out and return to pt. No further needs from CM at this time.    Expected Discharge Date:  09/14/14               Expected Discharge Plan:     In-House Referral:     Discharge planning Services     Post Acute Care Choice:    Choice offered to:     DME Arranged:    DME Agency:     HH Arranged:    HH Agency:     Status of Service:     Medicare Important Message Given:  Yes-second notification given Date Medicare IM Given:    Medicare IM give by:    Date Additional Medicare IM Given:    Additional Medicare Important Message give by:     If discussed at Long Length of Stay Meetings, dates discussed:    Additional Comments:  Gala Lewandowsky, RN 09/14/2014, 3:50 PM

## 2014-09-14 NOTE — Discharge Summary (Signed)
Discharge Summary   Patient ID: Joanne Thompson MRN: [id number], DOB/AGE: 79/02/1930 79 y.o. Admit date: 09/11/2014 D/C date:     09/14/2014  Primary Care Provider: No primary care provider on file. Primary Cardiologist: Joanne Thompson  Primary Discharge Diagnoses:  1. Anterior STEMI/CAD s/p DES to LAD; chronic total occlusion of mRCA with R-R/L-R collaterals, moderate Cx disease 2. Mild ICM EF 50% with inferior/apical mild HK by cath, 45-50% by echo 3. Hyperglycemia 4. Systolic murmur due to mild AS by echo 08/2014 5. Essential HTN 6. One run of NSVT on 09/12/14, possibly 2/2 reperfusion 7. H/o memory impairment  8. Abnormal CXR with possible lung density vs positional change, and possible abnormality of proximal humeri  Secondary Discharge Diagnoses:  1. H/o arthritis  Hospital Course: Joanne Thompson is an 81 y/o F with self-reported history of a heart attack "over 40 years ago" but otherwise no previous known significant PMH who presented to [facility] with chest pain and STEMI. It began abruptly 2 hours prior to presentation with 7-8/10 left-sided chest tightness radiating to the left arm with associated shortness of breath. She called 911 and was transported to the ED. EKG in route was concerning for anterior STEMI, prompted STEMI alert. The patient took ASA at home and received SL NTG with improvement in chest pain down to a 2-3/10. She was taken emergently to the cath lab showing occluded proximal LAD s/p DES placement, with chronic totally occlusion of the mRCA with collaterals right to right and from left-to-right, as well as mild-moderate circumflex/obtuse marginal disease. LVEF was 50% with inferior and apical mild HK. She was started on standard post MI therapy including DAPT, ACEI, BB and statin. Peak troponin was 45. In the middle of the night on 8/27 she had a 16 beat NSVT felt possibly due to reperfusion, asymptomatic. Telemetry from then on afterwards showed only NSR with  rare PVCs. She was also noted to have hyperglycemia in the 120s-130s, and was asked to f/u PCP to evaluate for diabetes (A1C not drawn this admission). She was also intermittently hypertensive with labile BPs at times - tendency more towards normotensive though on above therapies. Baseline lipids this admission showed tchol 129, trig 35, HLD 38, LDL 84. 2D echo was obtained for systolic murmur prior to D/C which showed LVEF 45-50% wit HK of the mid-inferior myocardium, grade 1 DD, high ventricular filing pressure, mild AS, mild MR, mildly dilated RV, mod dilated RA. She ambulated with cardiac rehab. They did note she had some memory issues which reported history of. She lives with her son (who verbalized understanding of cardiac rehab teaching). The patient declines phase II. She remains hemodynamically stable today and denies any further symptoms. Joanne Thompson has seen and examined the patient today and feels she is stable for discharge. Discharge information will be reviewed with the patient's son when he picks her up.  Issues for f/u: -  If the patient is tolerating statin at time of follow-up appointment, would consider rechecking liver function/lipid panel in 6-8 weeks. - Regarding CXR this admission, this showed "1. Hyperinflation. While there are is a history of left apical sarring, there is progressive volume loss in the left hemithorax nd ill-defined left suprahilar opacity. This may be related to differences in technique and positioning, however chest CT with contrast is recommended to exclude underlying mass lesion. 2. Hyperdensities projecting over both proximal humeri. It is unclear whether this represents vascular calcifications versus true bone abnormalities. Nonemergent humeral radiographs recommended."  The patient was informed of these findings and asked to f/u PCP for possible CT as outpatient - also noted on discharge paperwork.  Discharge Vitals: Blood pressure 127/63, pulse 68, temperature  99.1 F (37.3 C), temperature source Oral, resp. rate 16, height 5\' 6"  (1.676 m), weight 97 lb 4.8 oz (44.135 kg), SpO2 98 %.  Labs: Lab Results  Component Value Date   WBC 8.0 09/14/2014   HGB 12.9 09/14/2014   HCT 39.9 09/14/2014   MCV 90.3 09/14/2014   PLT 186 09/14/2014    Recent Labs Lab 09/12/14 0012  09/14/14 0535  NA 134*  < > 136  K 5.0  < > 4.2  CL 102  < > 106  CO2 24  < > 24  BUN 25*  < > 20  CREATININE 1.09*  < > 0.84  CALCIUM 8.0*  < > 8.2*  PROT 6.1*  --   --   BILITOT 1.3*  --   --   ALKPHOS 90  --   --   ALT 18  --   --   AST 37  --   --   GLUCOSE 146*  < > 136*  < > = values in this interval not displayed.  Recent Labs  09/12/14 0225 09/12/14 0847 09/12/14 1507  TROPONINI 3.49* 45.23* 44.50*   Lab Results  Component Value Date   CHOL 129 09/12/2014   HDL 38* 09/12/2014   LDLCALC 84 09/12/2014   TRIG 35 09/12/2014    Diagnostic Studies/Procedures   Dg Chest Port 1 View  09/12/2014   CLINICAL DATA:  Left-sided and central chest pain.  EXAM: PORTABLE CHEST - 1 VIEW  COMPARISON:  Frontal and lateral views 06/13/2010  FINDINGS: Probable volume loss in left hemithorax with ill-defined opacity in the left suprahilar region. The previous scarring at the left lung apex is partially obscured. The lungs remain hyperinflated. Cardiomediastinal contours are unchanged with atherosclerosis of the thoracic aorta. There is no pulmonary edema. No pneumothorax or large pleural effusion. Hyperdensity projecting over both proximal humeri.  IMPRESSION: 1. Hyperinflation. While there are is a history of left apical scarring, there is progressive volume loss in the left hemithorax and ill-defined left suprahilar opacity. This may be related to differences in technique and positioning, however chest CT with contrast is recommended to exclude underlying mass lesion. 2. Hyperdensities projecting over both proximal humeri. It is unclear whether this represents vascular  calcifications versus true bone abnormalities. Nonemergent humeral radiographs recommended.   Electronically Signed   By: Joanne Thompson M.D.   On: 09/12/2014 01:19   Cardiac catheterization this admission, please see full report and above for summary.  2D Echo 09/14/14 - Left ventricle: The cavity size was normal. Wall thickness was increased in a pattern of mild LVH. Systolic function was mildlyreduced. The estimated ejection fraction was in the range of 45%to 50%. There is hypokinesis of the midinferior myocardium.Doppler parameters are consistent with abnormal left ventricular relaxation (grade 1 diastolic dysfunction). Doppler parametersare consistent with high ventricular filling pressure. - Aortic valve: Valve mobility was restricted. There was mildstenosis. Peak velocity (S): 244 cm/s. Mean gradient (S): 12 mmHg. Valve area (VTI): 1.29 cm^2. Valve area (Vmax): 1.07 cm^2.Valve area (Vmean): 1.07 cm^2. - Mitral valve: There was mild regurgitation. - Right ventricle: The cavity size was mildly dilated. Wall thickness was normal. - Right atrium: The atrium was moderately dilated. Impressions: - When compared to prior, EF is reduced, inferior wall motion abnormality is new.  Discharge  Medications   Current Discharge Medication List    START taking these medications   Details  aspirin EC 81 MG EC tablet Take 1 tablet (81 mg total) by mouth daily. Qty: 30 tablet, Refills: 11    atorvastatin (LIPITOR) 80 MG tablet Take 1 tablet (80 mg total) by mouth every evening. Qty: 30 tablet, Refills: 6    carvedilol (COREG) 3.125 MG tablet Take 1 tablet (3.125 mg total) by mouth 2 (two) times daily with a meal. Qty: 60 tablet, Refills: 6    nitroGLYCERIN (NITROSTAT) 0.4 MG SL tablet Place 1 tablet (0.4 mg total) under the tongue every 5 (five) minutes as needed for chest pain (up to 3 doses). Qty: 25 tablet, Refills: 3    ramipril (ALTACE) 2.5 MG capsule Take 1 capsule (2.5 mg total) by  mouth daily. Qty: 30 capsule, Refills: 6    ticagrelor (BRILINTA) 90 MG TABS tablet Take 1 tablet (90 mg total) by mouth 2 (two) times daily. Qty: 60 tablet, Refills: 11      CONTINUE these medications which have NOT CHANGED   Details  acetaminophen (TYLENOL) 325 MG tablet Take 650 mg by mouth every 6 (six) hours as needed for mild pain.      STOP taking these medications     docusate sodium (COLACE) 100 MG capsule      HYDROcodone-acetaminophen (NORCO/VICODIN) 5-325 MG per tablet         Disposition   The patient will be discharged in stable condition to home. Discharge Instructions    Diet - low sodium heart healthy    Complete by:  As directed      Increase activity slowly    Complete by:  As directed   No driving for 2 weeks. No lifting over 10 lbs for 4 weeks. No sexual activity for 4 weeks. Keep procedure site clean & dry. If you notice increased pain, swelling, bleeding or pus, call/return!  You may shower, but no soaking baths/hot tubs/pools for 1 week.          Follow-up Information    Follow up with [facility] R, NP.   Specialties:  Cardiology, Radiology   Why:  [facility] [address] location - 09/22/14 at 11:30am. Please note your next appointment with Joanne Thompson after this will be at a different location.   Contact information:   [address] [place] [address] [phone]       Follow up with Primary Care Provider.   Why:  Your blood sugar was elevated in the 120s-130s during your hospitalization. Please schedule an appointment with your primary care doctor to further evaluate for diabetes.      Follow up with Primary Care Provider.   Why:  Your chest x-ray showed possible density in your lungs but this may have been related to your position in bed casting shadows. It also showed abnormalities in your shoulder bones. Please discuss further workup with primary doctor.        Duration of Discharge Encounter: Greater than 30  minutes including physician and PA time.  Maeola Harman, Alisa Stjames PA-C 09/14/2014, 2:11 PM

## 2014-09-14 NOTE — Progress Notes (Signed)
Pt. Discharged to home with son. Pt's capabilityof fully understanding and remembering discharge instructions was limited, but instructions.. Son refused to come up to room to go over discharge instructions, laiming he already discussed everything he needed to know with the cardiac rehab nurse. Son was retold the importance of the Brilinta prescription and going to follow-up appointments. All paperwork provided, including prescriptions. Geronimo Boot, RN

## 2014-09-14 NOTE — Progress Notes (Signed)
Echocardiogram 2D Echocardiogram has been performed.  Joanne Thompson 09/14/2014, 11:59 AM

## 2014-09-14 NOTE — Telephone Encounter (Signed)
New message     [facility] appointment on 9-6 @ 11:30 with Nada Boozer Appt made with Ronie Spies

## 2014-09-15 NOTE — Telephone Encounter (Signed)
Per pt D/C instructions she will be pt of Dr. Herbie Baltimore.  Sending to [facility] triage to review and call

## 2014-09-16 NOTE — Telephone Encounter (Signed)
TOC call to patient.She stated she is doing good.Stated she understands discharge instructions and her son is fixing her medications.Advised to keep post hospital with Nada Boozer NP 09/22/14 at 11:30 am at [facility] office.

## 2014-09-22 ENCOUNTER — Encounter: Payer: Self-pay | Admitting: Cardiology

## 2014-09-22 ENCOUNTER — Ambulatory Visit (INDEPENDENT_AMBULATORY_CARE_PROVIDER_SITE_OTHER): Payer: Medicare Other | Admitting: Cardiology

## 2014-09-22 VITALS — BP 132/70 | HR 67 | Ht 66.5 in | Wt 102.0 lb

## 2014-09-22 DIAGNOSIS — I2102 ST elevation (STEMI) myocardial infarction involving left anterior descending coronary artery: Secondary | ICD-10-CM

## 2014-09-22 DIAGNOSIS — R9389 Abnormal findings on diagnostic imaging of other specified body structures: Secondary | ICD-10-CM

## 2014-09-22 DIAGNOSIS — R938 Abnormal findings on diagnostic imaging of other specified body structures: Secondary | ICD-10-CM | POA: Diagnosis not present

## 2014-09-22 DIAGNOSIS — I2109 ST elevation (STEMI) myocardial infarction involving other coronary artery of anterior wall: Secondary | ICD-10-CM | POA: Diagnosis not present

## 2014-09-22 DIAGNOSIS — I10 Essential (primary) hypertension: Secondary | ICD-10-CM

## 2014-09-22 DIAGNOSIS — I255 Ischemic cardiomyopathy: Secondary | ICD-10-CM

## 2014-09-22 DIAGNOSIS — E785 Hyperlipidemia, unspecified: Secondary | ICD-10-CM

## 2014-09-22 LAB — BASIC METABOLIC PANEL
BUN: 18 mg/dL (ref 6–23)
CHLORIDE: 108 meq/L (ref 96–112)
CO2: 25 meq/L (ref 19–32)
Calcium: 8.7 mg/dL (ref 8.4–10.5)
Creatinine, Ser: 0.75 mg/dL (ref 0.40–1.20)
GFR: 78.65 mL/min (ref >60.00)
GLUCOSE: 83 mg/dL (ref 70–99)
POTASSIUM: 4 meq/L (ref 3.5–5.1)
Sodium: 141 mEq/L (ref 135–145)

## 2014-09-22 MED ORDER — RAMIPRIL 2.5 MG PO CAPS: 2.5000 mg | ORAL_CAPSULE | Freq: Two times a day (BID) | ORAL | Status: DC

## 2014-09-22 NOTE — Progress Notes (Signed)
Cardiology Office Note   Date:  09/22/2014   ID:  [name], DOB Jul 22, 1930, MRN [id number]  PCP:  No primary care provider on file.  Cardiologist:  Dr. Herbie Baltimore    Chief Complaint  Patient presents with  . Hospitalization Follow-up    STEMI- ant wall      History of Present Illness: Joanne Thompson is a 81 y.o. female who presents for hospital follow up after presentation for STEMI 09/12/14.  Her chest pain began 2 hours prior to admit.  EKG with ST elevation in V1-4.  She went emergently to the cath lab and found to have old 100% occluded RCA and new 100% occluded LAD.  promus premier placed.prox to Mid LAD.  She has residual LCX 50%.   She did well post procedure with troponin at 45.  She does continue to smoke 1ppd.   Today no chest pain since discharge and no SOB.  Her son is outspoken on hard it is to care for her.  He is very worried.  She continues to be active, cooking cleaning and up and down stairs.  She wanted to go back to work at [employer], but I have asked her not to work until 10/15/14.   On her admit  CXR  showed "1. Hyperinflation. While there are is a history of left apical sarring, there is progressive volume loss in the left hemithorax nd ill-defined left suprahilar opacity. This may be related to differences in technique and positioning, however chest CT with contrast is recommended to exclude underlying mass lesion. 2. Hyperdensities projecting over both proximal humeri. It is unclear whether this represents vascular calcifications versus true bone abnormalities. Nonemergent humeral radiographs recommended"- will schedule in next couple of weeks.  Pt does not have PCP and her son is stressed and not currently able to arrange.   Pt and her son are concerned about the cost of Brilinta.  Will have them meet with our pharmacist to see if they could go on indigent plan or another med.  Will also check with Dr. Herbie Baltimore.    Past Medical History  Diagnosis Date    . Degenerative arthritis of lumbar spine   . MI, old   . CAD (coronary artery disease)     a. Anterior STEMI 08/2014 - CAD s/p DES to LAD; chronic total occlusion of mRCA with R-R/L-R collaterals, moderate Cx disease. EF 50% with inferior/apical mild HK.  Marland Kitchen Hyperglycemia   . Essential hypertension   . NSVT (nonsustained ventricular tachycardia)     a. One run of NSVT on 09/12/14, possibly 2/2 reperfusion.  . Memory impairment   . Ischemic cardiomyopathy     a. Mild - cath 08/2014 showing EF 50% with inferior/apical mild HK, EF 45-50% by echo.  . Aortic stenosis     a. Mild by echo 08/2014.    Past Surgical History  Procedure Laterality Date  . Cardiac surgery      pericardial window  . Appendectomy    . Cardiac catheterization N/A 09/12/2014    Procedure: Left Heart Cath and Coronary Angiography;  Surgeon: Lyn Records, MD;  Location: [facility] INVASIVE CV LAB;  Service: Cardiovascular;  Laterality: N/A;  . Cardiac catheterization N/A 09/12/2014    Procedure: Coronary Stent Intervention;  Surgeon: Lyn Records, MD;  Location: [facility] INVASIVE CV LAB;  Service: Cardiovascular;  Laterality: N/A;     Current Outpatient Prescriptions  Medication Sig Dispense Refill  . acetaminophen (TYLENOL) 325 MG tablet Take  650 mg by mouth every 6 (six) hours as needed for mild pain.    Marland Kitchen aspirin EC 81 MG EC tablet Take 1 tablet (81 mg total) by mouth daily. 30 tablet 11  . atorvastatin (LIPITOR) 80 MG tablet Take 1 tablet (80 mg total) by mouth every evening. 30 tablet 6  . carvedilol (COREG) 3.125 MG tablet Take 1 tablet (3.125 mg total) by mouth 2 (two) times daily with a meal. 60 tablet 6  . nitroGLYCERIN (NITROSTAT) 0.4 MG SL tablet Place 1 tablet (0.4 mg total) under the tongue every 5 (five) minutes as needed for chest pain (up to 3 doses). 25 tablet 3  . ramipril (ALTACE) 2.5 MG capsule Take 1 capsule (2.5 mg total) by mouth daily. 30 capsule 6  . ticagrelor (BRILINTA) 90 MG TABS tablet Take 1 tablet (90  mg total) by mouth 2 (two) times daily. 60 tablet 11   No current facility-administered medications for this visit.    Allergies:   Review of patient's allergies indicates no known allergies.    Social History:  The patient  reports that she has been smoking Cigarettes.  She has been smoking about 0.50 packs per day. She has never used smokeless tobacco. She reports that she does not drink alcohol or use illicit drugs.   Family History:  The patient's family history includes Liver disease in her mother; Stroke in her father.    ROS:  General:no colds or fevers, some weight gain Skin:no rashes or ulcers HEENT:no blurred vision, no congestion CV:see HPI PUL:see HPI GI:no diarrhea constipation or melena, no indigestion GU:no hematuria, no dysuria MS:no joint pain, no claudication Neuro:no syncope, no lightheadedness Endo:no diabetes, no thyroid disease  Wt Readings from Last 3 Encounters:  09/22/14 102 lb (46.267 kg)  09/14/14 97 lb 4.8 oz (44.135 kg)     PHYSICAL EXAM: VS:  BP 132/70 mmHg  Pulse 67  Ht 5' 6.5" (1.689 m)  Wt 102 lb (46.267 kg)  BMI 16.22 kg/m2 , BMI Body mass index is 16.22 kg/(m^2). General:Pleasant affect, NAD Skin:Warm and dry, brisk capillary refill HEENT:normocephalic, sclera clear, mucus membranes moist Neck:supple, no JVD, no bruits  Heart:S1S2 RRR with 2/6 systolic murmur, no gallup, rub or click Lungs:clear without rales, rhonchi, or wheezes JXB:JYNW, non tender, + BS, do not palpate liver spleen or masses Ext:no lower ext edema, 2+ pedal pulses, 2+ radial pulses Neuro:alert and oriented X3, MAE, follows commands, + facial symmetry    EKG:  EKG is ordered today. The ekg ordered today demonstrates SR with RBBB + PVC similar to EKG at the hospital though less deep T waves.       Recent Labs: 09/12/2014: ALT 18; B Natriuretic Peptide 129.9*; TSH 4.450 09/14/2014: BUN 20; Creatinine, Ser 0.84; Hemoglobin 12.9; Platelets 186; Potassium 4.2; Sodium  136    Lipid Panel    Component Value Date/Time   CHOL 129 09/12/2014 0400   TRIG 35 09/12/2014 0400   HDL 38* 09/12/2014 0400   CHOLHDL 3.4 09/12/2014 0400   VLDL 7 09/12/2014 0400   LDLCALC 84 09/12/2014 0400       Other studies Reviewed: Additional studies/ records that were reviewed today include:  Cardiac cath I reviewed.   Mid RCA to Dist RCA lesion, 100% stenosed.  Mid Cx to Dist Cx lesion, 50% stenosed.  Ramus lesion, 65% stenosed.  Prox LAD to Mid LAD lesion, 100% stenosed. There is a 0% residual stenosis post intervention.  A drug-eluting stent was placed.  Acute anterior myocardial infarction due to occlusion of the proximal LAD  Successful LAD recannalization and placement of a drug-eluting stent reducing a 100% stenosis to 0% with TIMI grade 3 flow.  Chronic total occlusion of the mid right coronary. The right coronary is collateralized right to right and from left-to-right.  Mild-to-moderate circumflex/obtuse marginal disease as noted above.  Inferior and apical mild hypokinesis. Overall LVEF 50%.    ECHO. Study Conclusions  - Left ventricle: The cavity size was normal. Wall thickness was increased in a pattern of mild LVH. Systolic function was mildly reduced. The estimated ejection fraction was in the range of 45% to 50%. There is hypokinesis of the midinferior myocardium. Doppler parameters are consistent with abnormal left ventricular relaxation (grade 1 diastolic dysfunction). Doppler parameters are consistent with high ventricular filling pressure. - Aortic valve: Valve mobility was restricted. There was mild stenosis. Peak velocity (S): 244 cm/s. Mean gradient (S): 12 mm Hg. Valve area (VTI): 1.29 cm^2. Valve area (Vmax): 1.07 cm^2. Valve area (Vmean): 1.07 cm^2. - Mitral valve: There was mild regurgitation. - Right ventricle: The cavity size was mildly dilated. Wall thickness was normal. - Right atrium: The  atrium was moderately dilated.  Impressions:  - When compared to prior, EF is reduced, inferior wall motion abnormality is new.   ASSESSMENT AND PLAN:  1. Anterior STEMI/CAD s/p DES to LAD; chronic total occlusion of mRCA with R-R/L-R collaterals, moderate Cx disease- on asa and Brilinta, no further chest pain.  Currently stable, have asked her not to work until 10/15/14. On BB, statin, ASA, Brilinta, ACE.  follow up with Dr. Ranae Palms in 6 weeks   2. Mild ICM EF 50% with inferior/apical mild HK by cath, 45-50% by echo- increasing altace to 2.5 mg BID will check BMP    3. Hyperglycemia- will check glucose today- her hgb A1c was 6.5.  She does need PCP for further management.   4. Systolic murmur due to mild AS by echo 08/2014  5. Essential HTN- stable  7. H/o memory impairment - son with her.  8. Abnormal CXR with possible lung density vs positional change, and possible abnormality of proximal humeri- for CT with contrast in 2 weeks and bil humerus films   9. Hyperlipidemia on statin for lipid and hepatic in 6 weeks    Current medicines are reviewed with the patient today.  The patient Has no concerns regarding medicines.  The following changes have been made:  See above Labs/ tests ordered today include:see above  Disposition:   FU:  see above  Nyoka Lint, NP  09/22/2014 12:48 PM    [facility] [address] [place], [place], [place]  [address]/ [address] [place], [place] Phone: [phone]; Fax: [phone]  [phone]

## 2014-09-22 NOTE — Patient Instructions (Signed)
Medication Instructions:  Your physician has recommended you make the following change in your medication:  1.  Increase the Altrace to 2.5 tablets twice a day.  A new prescription has been sent to your local pharmacy   Labwork: TODAY - BMET  LIPID AND KIDNEY FUNCTION in 6 WEEKS  Testing/Procedures: Your physician has requested that you have A CT Chest With contrast and Xrays of bilateral arms.       Follow-Up: Your physician recommends that you schedule a follow-up appointment in: 6 WEEKS WITH DR. HARDING  Your physician recommends that you schedule a follow-up appointment ASAP with the Pharmacist to see about getting assistance with your medications.

## 2014-09-29 ENCOUNTER — Ambulatory Visit (HOSPITAL_COMMUNITY)
Admission: RE | Admit: 2014-09-29 | Discharge: 2014-09-29 | Disposition: A | Discharge status: Home / Self Care | Payer: Medicare Other | Source: Ambulatory Visit | Attending: Cardiology | Admitting: Cardiology

## 2014-09-29 ENCOUNTER — Encounter (HOSPITAL_COMMUNITY): Payer: Self-pay

## 2014-09-29 ENCOUNTER — Encounter: Payer: Self-pay | Admitting: Pharmacist Clinician (PhC)/ Clinical Pharmacy Specialist

## 2014-09-29 ENCOUNTER — Ambulatory Visit (INDEPENDENT_AMBULATORY_CARE_PROVIDER_SITE_OTHER): Payer: Medicare Other | Admitting: Pharmacist Clinician (PhC)/ Clinical Pharmacy Specialist

## 2014-09-29 VITALS — Ht 66.5 in

## 2014-09-29 DIAGNOSIS — R918 Other nonspecific abnormal finding of lung field: Secondary | ICD-10-CM | POA: Diagnosis not present

## 2014-09-29 DIAGNOSIS — R937 Abnormal findings on diagnostic imaging of other parts of musculoskeletal system: Secondary | ICD-10-CM | POA: Diagnosis not present

## 2014-09-29 DIAGNOSIS — R9389 Abnormal findings on diagnostic imaging of other specified body structures: Secondary | ICD-10-CM

## 2014-09-29 DIAGNOSIS — M859 Disorder of bone density and structure, unspecified: Secondary | ICD-10-CM | POA: Diagnosis not present

## 2014-09-29 DIAGNOSIS — R938 Abnormal findings on diagnostic imaging of other specified body structures: Secondary | ICD-10-CM | POA: Diagnosis not present

## 2014-09-29 DIAGNOSIS — I2109 ST elevation (STEMI) myocardial infarction involving other coronary artery of anterior wall: Secondary | ICD-10-CM

## 2014-09-29 DIAGNOSIS — I2102 ST elevation (STEMI) myocardial infarction involving left anterior descending coronary artery: Secondary | ICD-10-CM

## 2014-09-29 MED ORDER — IOHEXOL 300 MG/ML  SOLN
75.0000 mL | Freq: Once | INTRAMUSCULAR | Status: AC | PRN
  Administered 2014-09-29: 75 mL via INTRAVENOUS

## 2014-09-29 NOTE — Patient Instructions (Signed)
Contact SHIP program about helping to pay for medical insurance and prescriptions  [email or web address]   Bring a copy of proof of income to our [employer] office, [address].  In the glass building above the [employer]

## 2014-09-29 NOTE — Progress Notes (Signed)
Patient in office today with her son, needing help with prescription prices.  Was recently started on Brilinta 90 mg bid after STEMI 09/12/14.  She is on Medicare, son is not aware of any supplements to that.  All of her other medications are generics, and cost around $100 per month.  Unfortunately the Brilinta is > $300 per month.  She is about 1/2 finished with her free 30 days supply.    We filled [place] paperwork for [place] [employer] [place] to see if she can get it directly.  Also have given her son information on the [facility] program to see if she qualifies for assistance there.

## 2014-10-01 ENCOUNTER — Telehealth: Payer: Self-pay

## 2014-10-01 ENCOUNTER — Telehealth: Payer: Self-pay | Admitting: Cardiology

## 2014-10-01 DIAGNOSIS — R918 Other nonspecific abnormal finding of lung field: Secondary | ICD-10-CM

## 2014-10-01 DIAGNOSIS — E785 Hyperlipidemia, unspecified: Secondary | ICD-10-CM

## 2014-10-01 NOTE — Telephone Encounter (Signed)
Pt's son is only number I have, I left message that the cat scan of lung had an area and we were concerned about cancer.  She would need a PET scan and then see surgeon, Dr. Tyrone Sage.  Pt will call back to Dr. Michaelle Copas Nurse who can contact me and I will be glad to talk with the pt.  Otherwise Dr. Michaelle Copas nurse will arrange PET scan and appt with Dr. Tyrone Sage.

## 2014-10-01 NOTE — Telephone Encounter (Signed)
-----   Message from Lyn Records, MD sent at 09/30/2014  4:51 PM EDT ----- Let patient know that she has a "lesion on CT that we need to further evaluate". Schedule PET scan to r/o Cancer and refer to [facility], Dr. Tyrone Sage. I need her to f/u with me after PET

## 2014-10-01 NOTE — Telephone Encounter (Signed)
Pt son sts that pt is aware of CT results. Adv him that of Dr.Smith recommendations. ref to Dr.Gerhardt. Per Dr.Smith we will defer to Dr.Gerhartd to order pt's PET a recommended. Adv pt son a scheduler from Dr.Gerhardt's office will call them to schedule.

## 2014-10-07 ENCOUNTER — Institutional Professional Consult (permissible substitution) (INDEPENDENT_AMBULATORY_CARE_PROVIDER_SITE_OTHER): Payer: Medicare Other | Admitting: Cardiothoracic Surgery

## 2014-10-07 ENCOUNTER — Other Ambulatory Visit: Payer: Self-pay | Admitting: *Deleted

## 2014-10-07 ENCOUNTER — Encounter: Payer: Self-pay | Admitting: Cardiothoracic Surgery

## 2014-10-07 VITALS — BP 193/88 | HR 78 | Resp 16 | Ht 66.5 in | Wt 118.0 lb

## 2014-10-07 DIAGNOSIS — I255 Ischemic cardiomyopathy: Secondary | ICD-10-CM | POA: Diagnosis not present

## 2014-10-07 DIAGNOSIS — R918 Other nonspecific abnormal finding of lung field: Secondary | ICD-10-CM

## 2014-10-07 NOTE — Progress Notes (Signed)
[address]       [place] [address]             [phone]                    LIBI CORSO [facility] Medical Record #[id number] Date of Birth: [birth year]  Referring: Lyn Records, MD Primary Care: No primary care provider on file.  Chief Complaint:    Chief Complaint  Patient presents with  . Lung Mass    LULobe per CT CHEST 09/29/14    History of Present Illness:    Joanne Thompson 81 y.o. female is seen in the office  today for for a left upper lobe lung mass recently noted in late August when she was admitted with an acute anterior myocardial infarction. The patient has significant memory problems. She was not sure why she was even being seen in the office today. Looking back in her old history CT scans back to 2007 and 2003 mention a left upper lobe lung mass. She had seen Dr. Edwyna Shell in the past , but had never had surgery for this.  The older CT scans are not available but comparing the reports what is now present has definitely enlarged in size from 2.42 over 4 cm. The patient continues to smoke as she has for more than 50 years, at least a pack a day.       Current Activity/ Functional Status:  Patient is independent with mobility/ambulation, transfers, ADL's, IADL's.   Zubrod Score: At the time of surgery this patient's most appropriate activity status/level should be described as:     0    Normal activity, no symptoms     1    Restricted in physical strenuous activity but ambulatory, able to do out light work     2    Ambulatory and capable of self care, unable to do work activities, up and about               >50 % of waking hours                                  3    Only limited self care, in bed greater than 50% of waking hours     4    Completely disabled, no self care, confined to bed or chair     5    Moribund   Past Medical History  Diagnosis Date  . Degenerative arthritis of lumbar spine   . MI, old   .  CAD (coronary artery disease)     a. Anterior STEMI 08/2014 - CAD s/p DES to LAD; chronic total occlusion of mRCA with R-R/L-R collaterals, moderate Cx disease. EF 50% with inferior/apical mild HK.  Marland Kitchen Hyperglycemia   . Essential hypertension   . NSVT (nonsustained ventricular tachycardia)     a. One run of NSVT on 09/12/14, possibly 2/2 reperfusion.  . Memory impairment   . Ischemic cardiomyopathy     a. Mild - cath 08/2014 showing EF 50% with inferior/apical mild HK, EF 45-50% by echo.  . Aortic stenosis     a. Mild by echo 08/2014.    Past Surgical History  Procedure Laterality Date  . Cardiac surgery      pericardial window  . Appendectomy    . Cardiac catheterization N/A 09/12/2014    Procedure:  Left Heart Cath and Coronary Angiography;  Surgeon: Lyn Records, MD;  Location: [facility] INVASIVE CV LAB;  Service: Cardiovascular;  Laterality: N/A;  . Cardiac catheterization N/A 09/12/2014    Procedure: Coronary Stent Intervention;  Surgeon: Lyn Records, MD;  Location: [facility] INVASIVE CV LAB;  Service: Cardiovascular;  Laterality: N/A;    Family History  Problem Relation Age of Onset  . Liver disease Mother   . Stroke Father     Social History   Social History  . Marital Status: Widowed    Spouse Name: N/A  . Number of Children: N/A  . Years of Education: N/A   Occupational History  . Not on file.   Social History Main Topics  . Smoking status: Current Every Day Smoker -- 0.50 packs/day for 60 years    Types: Cigarettes  . Smokeless tobacco: Never Used     Comment: a pack day all her life, will try to quit   . Alcohol Use: No  . Drug Use: No  . Sexual Activity: No   Other Topics Concern  . Not on file   Social History Narrative    History  Smoking status  . Current Every Day Smoker -- 0.50 packs/day for 60 years  . Types: Cigarettes  Smokeless tobacco  . Never Used    Comment: a pack day all her life, will try to quit     History  Alcohol Use No     No Known  Allergies  Current Outpatient Prescriptions  Medication Sig Dispense Refill  . acetaminophen (TYLENOL) 325 MG tablet Take 650 mg by mouth every 6 (six) hours as needed for mild pain.    Marland Kitchen aspirin EC 81 MG EC tablet Take 1 tablet (81 mg total) by mouth daily. 30 tablet 11  . atorvastatin (LIPITOR) 80 MG tablet Take 1 tablet (80 mg total) by mouth every evening. 30 tablet 6  . carvedilol (COREG) 3.125 MG tablet Take 1 tablet (3.125 mg total) by mouth 2 (two) times daily with a meal. 60 tablet 6  . nitroGLYCERIN (NITROSTAT) 0.4 MG SL tablet Place 1 tablet (0.4 mg total) under the tongue every 5 (five) minutes as needed for chest pain (up to 3 doses). 25 tablet 3  . ramipril (ALTACE) 2.5 MG capsule Take 1 capsule (2.5 mg total) by mouth 2 (two) times daily. 60 capsule 6  . ticagrelor (BRILINTA) 90 MG TABS tablet Take 1 tablet (90 mg total) by mouth 2 (two) times daily. 60 tablet 11   No current facility-administered medications for this visit.      Review of Systems:     Cardiac Review of Systems: Y or N  Chest Pain [ y   ]  Resting SOB [ y  ] Exertional SOB  y[  ]  Orthopnea [ y ]   Pedal Edema [  n ]    Palpitations [ n ] Syncope  [n  ]   Presyncope [ y  ]  General Review of Systems: [Y] = yes [  ]=no Constitional: recent weight change [  ];  Wt loss over the last 3 months [   ] anorexia [  ]; fatigue [  ]; nausea [  ]; night sweats [  ]; fever [  ]; or chills [  ];          Dental: poor dentition[  ]; Last Dentist visit:   Eye : blurred vision [  ]; diplopia [   ]; vision  changes [  ];  Amaurosis fugax[  ]; Resp: cough [  ];  wheezing[  ];  hemoptysis[  ]; shortness of breath[  ]; paroxysmal nocturnal dyspnea[  ]; dyspnea on exertion[  ]; or orthopnea[  ];  GI:  gallstones[  ], vomiting[  ];  dysphagia[  ]; melena[  ];  hematochezia [  ]; heartburn[  ];   Hx of  Colonoscopy[  ]; GU: kidney stones [  ]; hematuria[  ];   dysuria [  ];  nocturia[  ];  history of     obstruction [  ]; urinary  frequency [  ]             Skin: rash, swelling[  ];, hair loss[  ];  peripheral edema[  ];  or itching[  ]; Musculosketetal: myalgias[  ];  joint swelling[  ];  joint erythema[  ];  joint pain[  ];  back pain[  ];  Heme/Lymph: bruising[  ];  bleeding[  ];  anemia[  ];  Neuro: TIA[  ];  headaches[  ];  stroke[  ];  vertigo[  ];  seizures[  ];   paresthesias[  ];  difficulty walking[  ];  Psych:depression[  ]; anxiety[  ];  Endocrine: diabetes[  ];  thyroid dysfunction[  ];  Immunizations: Flu up to date [ ? ]; Pneumococcal up to date [?  ];  Other:  Physical Exam: BP 193/88 mmHg  Pulse 78  Resp 16  Ht 5' 6.5" (1.689 m)  Wt 118 lb (53.524 kg)  BMI 18.76 kg/m2  SpO2 99%  PHYSICAL EXAMINATION: General appearance: alert, cooperative, appears older than stated age, cachectic and no distress Head: Normocephalic, without obvious abnormality, atraumatic Neck: no adenopathy, no carotid bruit, no JVD, supple, symmetrical, trachea midline and thyroid not enlarged, symmetric, no tenderness/mass/nodules Lymph nodes: Cervical, supraclavicular, and axillary nodes normal. Resp: clear to auscultation bilaterally Back: symmetric, no curvature. ROM normal. No CVA tenderness. Cardio: regular rate and rhythm, S1, S2 normal, 2/6 holosystolic murmur heard at the left second intercostal space consistent with aortic stenosis, click, rub or gallop GI: soft, non-tender; bowel sounds normal; no masses,  no organomegaly Extremities: extremities normal, atraumatic, no cyanosis or edema and Homans sign is negative, no sign of DVT Neurologic: Grossly normal  Diagnostic Studies & Laboratory data:     Recent Radiology Findings:   Ct Chest W Contrast  09/29/2014   CLINICAL DATA:  Abnormal x-ray, density is seen on the left side on chest x-ray.  EXAM: CT CHEST WITH CONTRAST  TECHNIQUE: Multidetector CT imaging of the chest was performed during intravenous contrast administration.  CONTRAST:  75mL OMNIPAQUE IOHEXOL  300 MG/ML  SOLN  COMPARISON:  Chest x-ray 09/12/2014.  FINDINGS: There is a rounded masslike opacity in the left upper lobe measuring 4.8 x 3.7 cm. This is concerning for malignancy. This extends from the left superior hilum to the anterior apical pleural surface.  Small subpleural nodules in the right lower lobe measuring 3 mm or less in size no additional pulmonary nodules. Biapical scarring. No pleural effusions.  There is mild cardiomegaly. Coronary artery calcifications and aortic calcifications present. Ectasia and tortuosity of the thoracic aorta. No mediastinal, hilar, or axillary adenopathy. Chest wall soft tissues are unremarkable. Imaging into the upper abdomen shows no acute findings.  IMPRESSION: 4.8 cm rounded masslike opacity in the left upper lobe concerning for malignancy. May consider further evaluation with PET CT.  Small right lower lobe subpleural nodules, nonspecific.  Recommend attention on followup imaging.  Coronary artery and aortic atherosclerosis.  Mild cardiomegaly.   Electronically Signed   By: Charlett Nose M.D.   On: 09/29/2014 11:09   Dg Chest Port 1 View  09/12/2014   CLINICAL DATA:  Left-sided and central chest pain.  EXAM: PORTABLE CHEST - 1 VIEW  COMPARISON:  Frontal and lateral views 06/13/2010  FINDINGS: Probable volume loss in left hemithorax with ill-defined opacity in the left suprahilar region. The previous scarring at the left lung apex is partially obscured. The lungs remain hyperinflated. Cardiomediastinal contours are unchanged with atherosclerosis of the thoracic aorta. There is no pulmonary edema. No pneumothorax or large pleural effusion. Hyperdensity projecting over both proximal humeri.  IMPRESSION: 1. Hyperinflation. While there are is a history of left apical scarring, there is progressive volume loss in the left hemithorax and ill-defined left suprahilar opacity. This may be related to differences in technique and positioning, however chest CT with contrast is  recommended to exclude underlying mass lesion. 2. Hyperdensities projecting over both proximal humeri. It is unclear whether this represents vascular calcifications versus true bone abnormalities. Nonemergent humeral radiographs recommended.   Electronically Signed   By: Rubye Oaks M.D.   On: 09/12/2014 01:19   Dg Humerus Left  09/29/2014   CLINICAL DATA:  Abnormal x-ray.  EXAM: LEFT HUMERUS - 2+ VIEW  COMPARISON:  September 12, 2014.  FINDINGS: There is no evidence of fracture or other focal bone lesions. The hyperdensities noted on prior chest radiograph represent vascular calcifications.  IMPRESSION: Normal left humerus. Previously noted hyperdensities in left upper arm are consistent with vascular calcifications.   Electronically Signed   By: Lupita Raider, M.D.   On: 09/29/2014 09:53   Dg Humerus Right  09/29/2014   CLINICAL DATA:  Abnormal density over upper extremity on prior x-ray. Prior MI.  EXAM: RIGHT HUMERUS - 2+ VIEW  COMPARISON:  Chest x-ray 08/05/2014  FINDINGS: Diffuse osteopenia. Diffuse degenerative changes noted the acromioclavicular glenohumeral joints. Postsurgical changes noted about the right shoulder. Benign dystrophic calcifications are noted about the right upper extremity. Old right rib fractures. No acute bony or joint abnormality.  IMPRESSION: Diffuse osteopenia and degenerative change with dystrophic calcifications noted about soft tissues adjacent to the humerus. Postsurgical changes right shoulder. No acute or focal significant abnormality.   Electronically Signed   By: Maisie Fus  Register   On: 09/29/2014 09:54     I have independently reviewed the above radiologic studies.  Recent Lab Findings: Lab Results  Component Value Date   WBC 8.0 09/14/2014   HGB 12.9 09/14/2014   HCT 39.9 09/14/2014   PLT 186 09/14/2014   GLUCOSE 83 09/22/2014   CHOL 129 09/12/2014   TRIG 35 09/12/2014   HDL 38* 09/12/2014   LDLCALC 84 09/12/2014   ALT 18 09/12/2014   AST 37  09/12/2014   NA 141 09/22/2014   K 4.0 09/22/2014   CL 108 09/22/2014   CREATININE 0.75 09/22/2014   BUN 18 09/22/2014   CO2 25 09/22/2014   TSH 4.450 09/12/2014   HGBA1C 6.5* 09/12/2014      Assessment / Plan:   4 cm left upper lobe lung mass on recent CT scan, by history has had a left upper lobe lung mass for 15-17 years but by the description from previous reports it is enlarging. In a patient with a heavy smoking history it is highly concerning that this may be a carcinoma originating in the previous scar. I discussed  this with the patient and her son. With her poor pulmonary function, poor functional status, probable dementia and recent anterior myocardial infarction she would be a very poor candidate to consider for surgical resection of her left upper lobe. In talking to her and her son she is agreeable with proceeding with further workup with PET scan and potential biopsy depending on the findings.       I  spent 60 minutes counseling the patient face to face and 50% or more the  time was spent in counseling and coordination of care. The total time spent in the appointment was 40 minutes.  Delight Ovens MD      [address] [place] [place] [address] Office [phone]   Beeper [phone]  10/07/2014 5:41 PM

## 2014-10-16 ENCOUNTER — Ambulatory Visit (HOSPITAL_COMMUNITY): Payer: Medicare Other

## 2014-10-20 ENCOUNTER — Telehealth: Payer: Self-pay | Admitting: *Deleted

## 2014-10-22 ENCOUNTER — Encounter: Payer: Medicare Other | Admitting: Cardiothoracic Surgery

## 2014-10-26 ENCOUNTER — Other Ambulatory Visit: Payer: Medicare Other | Admitting: *Deleted

## 2014-10-26 DIAGNOSIS — R938 Abnormal findings on diagnostic imaging of other specified body structures: Secondary | ICD-10-CM | POA: Diagnosis not present

## 2014-10-26 DIAGNOSIS — R9389 Abnormal findings on diagnostic imaging of other specified body structures: Secondary | ICD-10-CM

## 2014-10-26 DIAGNOSIS — I2109 ST elevation (STEMI) myocardial infarction involving other coronary artery of anterior wall: Secondary | ICD-10-CM

## 2014-10-26 LAB — HEPATIC FUNCTION PANEL
ALBUMIN: 3.2 g/dL — AB (ref 3.6–5.1)
ALT: 59 U/L — AB (ref 6–29)
AST: 57 U/L — AB (ref 10–35)
Alkaline Phosphatase: 151 U/L — ABNORMAL HIGH (ref 33–130)
Bilirubin, Direct: 0.2 mg/dL (ref <=0.2)
Indirect Bilirubin: 0.5 mg/dL (ref 0.2–1.2)
TOTAL PROTEIN: 6.2 g/dL (ref 6.1–8.1)
Total Bilirubin: 0.7 mg/dL (ref 0.2–1.2)

## 2014-10-26 LAB — LIPID PANEL
CHOL/HDL RATIO: 2.4 ratio (ref <=5.0)
CHOLESTEROL: 80 mg/dL — AB (ref 125–200)
HDL: 33 mg/dL — AB (ref >=46)
LDL Cholesterol: 39 mg/dL (ref <130)
Triglycerides: 38 mg/dL (ref <150)
VLDL: 8 mg/dL (ref <30)

## 2014-10-27 ENCOUNTER — Other Ambulatory Visit: Payer: Self-pay

## 2014-10-30 ENCOUNTER — Telehealth: Payer: Self-pay

## 2014-10-30 DIAGNOSIS — I10 Essential (primary) hypertension: Secondary | ICD-10-CM

## 2014-10-30 DIAGNOSIS — I255 Ischemic cardiomyopathy: Secondary | ICD-10-CM

## 2014-10-30 DIAGNOSIS — I2102 ST elevation (STEMI) myocardial infarction involving left anterior descending coronary artery: Secondary | ICD-10-CM

## 2014-10-30 DIAGNOSIS — I350 Nonrheumatic aortic (valve) stenosis: Secondary | ICD-10-CM

## 2014-10-30 DIAGNOSIS — R739 Hyperglycemia, unspecified: Secondary | ICD-10-CM

## 2014-10-30 DIAGNOSIS — Z9861 Coronary angioplasty status: Secondary | ICD-10-CM

## 2014-10-30 DIAGNOSIS — I2510 Atherosclerotic heart disease of native coronary artery without angina pectoris: Secondary | ICD-10-CM

## 2014-10-30 MED ORDER — ATORVASTATIN CALCIUM 80 MG PO TABS: 40.0000 mg | ORAL_TABLET | ORAL | Status: DC

## 2014-10-30 NOTE — Addendum Note (Signed)
Addended by: Gunnar Fusi A on: 10/30/2014 08:37 AM   Modules accepted: Orders

## 2014-10-30 NOTE — Telephone Encounter (Signed)
-----   Message from Lyn Records, MD sent at 10/29/2014  2:28 PM EDT ----- Decrease the atovastatin to 1/2 tablet on M, W, F. Repeat Lipid and liver in 6 weeks.

## 2014-10-30 NOTE — Telephone Encounter (Signed)
Informed patient's DPR of results and verbal understanding expressed.   Instructed him to DECREASE ATORVASTATIN to half tablet MWF. FLP and LFTs scheduled November 30. DPR agrees with treatment plan.

## 2014-11-02 ENCOUNTER — Ambulatory Visit (HOSPITAL_COMMUNITY): Payer: Medicare Other

## 2014-11-02 ENCOUNTER — Ambulatory Visit: Payer: Medicare Other | Admitting: Cardiology

## 2014-11-12 ENCOUNTER — Encounter: Payer: Medicare Other | Admitting: Cardiothoracic Surgery

## 2014-11-16 NOTE — Telephone Encounter (Signed)
Pt son aware of f/u appt scheduled with Dr.Smith for 11/30 @ 8:15am. Pt will have repeat fasting labs lipid and alt the same day.  Pt son rqst samples of Brilinta adv him that they will left at the front desk for pick up. Pt son voiced appreciation and verbalized understanding.

## 2014-11-16 NOTE — Addendum Note (Signed)
Addended by: Jarvis Newcomer on: 11/16/2014 08:41 AM   Modules accepted: Orders

## 2014-11-25 ENCOUNTER — Telehealth: Payer: Self-pay | Admitting: Pharmacist Clinician (PhC)/ Clinical Pharmacy Specialist

## 2014-11-25 MED ORDER — TICAGRELOR 90 MG PO TABS: 90.0000 mg | ORAL_TABLET | Freq: Two times a day (BID) | ORAL | Status: DC

## 2014-11-25 NOTE — Telephone Encounter (Signed)
Samples requested by son.  Asked about [place] patient assistance paperwork given to them about 1 month ago.  Son has not filled out, as he cannot get mother to answer certain questions.  Advised he should answer to the best of his ability and get paperwork out to them.  It is important that she not skip doses of this.  Son voiced understanding

## 2014-12-16 ENCOUNTER — Other Ambulatory Visit (INDEPENDENT_AMBULATORY_CARE_PROVIDER_SITE_OTHER): Payer: Medicare Other

## 2014-12-16 ENCOUNTER — Ambulatory Visit: Payer: Medicare Other | Admitting: Interventional Cardiology

## 2014-12-16 DIAGNOSIS — I350 Nonrheumatic aortic (valve) stenosis: Secondary | ICD-10-CM | POA: Diagnosis not present

## 2014-12-16 NOTE — Addendum Note (Signed)
Addended by: Tonita Phoenix on: 12/16/2014 07:40 AM   Modules accepted: Orders

## 2015-01-03 NOTE — Progress Notes (Signed)
This encounter was created in error - please disregard.

## 2015-01-04 ENCOUNTER — Encounter: Payer: Medicare Other | Admitting: Physician Assistant

## 2015-01-04 ENCOUNTER — Other Ambulatory Visit: Payer: Medicare Other

## 2015-01-04 ENCOUNTER — Telehealth: Payer: Self-pay | Admitting: Interventional Cardiology

## 2015-01-04 NOTE — Telephone Encounter (Signed)
informed her sone Joanne Thompson that 2 bottles of brilinta was left up front

## 2015-01-04 NOTE — Telephone Encounter (Signed)
Patient calling the office for samples of medication:   1.  What medication and dosage are you requesting samples for? Brilanta   2.  Are you currently out of this medication? Yes

## 2015-01-21 ENCOUNTER — Telehealth: Payer: Self-pay | Admitting: Cardiology

## 2015-01-21 NOTE — Telephone Encounter (Signed)
Medication samples have been provided to the patient.  Drug name: Brilinta 90 mg Qty: 64 tabs LOT: [id number] Exp.Date: 02/2017  Samples left at front desk for patient pick-up. Patient's son notified and is aware samples are at [facility], [place] 3:25 PM 01/21/2015

## 2015-01-21 NOTE — Progress Notes (Signed)
Cardiology Office Note   Date:  01/22/2015   ID:  Joanne Thompson, DOB 10-12-1930, MRN [id number]  PCP:  No primary care provider on file.  Cardiologist: Dr. Herbie Baltimore   CAD - follow up    History of Present Illness: Joanne Thompson is a 82 y.o. female with a history of CAD s/p DES to LAD, continued tobacco abuse, pulmonary nodule, HTN, ischemic CM, mild AS and dementia who presents to clinic for follow up.   She presented with anterior STEMI (09/12/14). Emergent cath found 100% occluded RCA and new 100% occluded LAD s/p DES to prox-mid LAD. Chronic total occlusion of the mid right coronary. The right coronary is collateralized right to right and from left-to-right. She has residual LCX 50%. EF 50% with inferior/apical mild HK by cath, 45-50% by echo.  Seen by Nada Boozer NP 10/02/14 for post hospital follow up and she was doing well from a cardiac standpoint. Her son was with her on that visit and was outspoken on how hard it is to care for her. He was very worried. She continues to be active, cooking cleaning and up and down stairs. She wanted to go back to work at [employer], but she was asked her not to work until 10/15/14. Of note, she has an enlarging 4.8 cm LUL lung mass on recent CT scan. She was seen by Dr. Lyn Henri on 10/07/14 in consultation. He felt that this was highly concerning for malignancy in a patient with a heavy smoking history and continued tobacco abuse. With her poor pulmonary function, poor functional status, probable dementia and recent anterior myocardial infarction she would be a very poor candidate to consider for surgical resection of her left upper lobe. In talking to her and her son she was agreeable with proceeding with further workup with PET scan and potential biopsy depending on the findings. However, they now are deciding not to do the PET scan.  Today she presents for follow up. She continues to smoke 1 PPD. No CP or SOB. No LE edema, orthopnea or PND. No  blood in her stool or urine. No dizziness or syncope. She is tolerating all her medicines well. She has no complaints    Past Medical History  Diagnosis Date  . Degenerative arthritis of lumbar spine   . MI, old   . CAD (coronary artery disease)     a. Anterior STEMI 08/2014 - CAD s/p DES to LAD; chronic total occlusion of mRCA with R-R/L-R collaterals, moderate Cx disease. EF 50% with inferior/apical mild HK.  Marland Kitchen Hyperglycemia   . Essential hypertension   . NSVT (nonsustained ventricular tachycardia) (HCC)     a. One run of NSVT on 09/12/14, possibly 2/2 reperfusion.  . Memory impairment   . Ischemic cardiomyopathy     a. Mild - cath 08/2014 showing EF 50% with inferior/apical mild HK, EF 45-50% by echo.  . Aortic stenosis     a. Mild by echo 08/2014.    Past Surgical History  Procedure Laterality Date  . Cardiac surgery      pericardial window  . Appendectomy    . Cardiac catheterization N/A 09/12/2014    Procedure: Left Heart Cath and Coronary Angiography;  Surgeon: Lyn Records, MD;  Location: [facility] INVASIVE CV LAB;  Service: Cardiovascular;  Laterality: N/A;  . Cardiac catheterization N/A 09/12/2014    Procedure: Coronary Stent Intervention;  Surgeon: Lyn Records, MD;  Location: [facility] INVASIVE CV LAB;  Service: Cardiovascular;  Laterality:  N/A;     Current Outpatient Prescriptions  Medication Sig Dispense Refill  . acetaminophen (TYLENOL) 325 MG tablet Take 650 mg by mouth every 6 (six) hours as needed for mild pain.    Marland Kitchen aspirin EC 81 MG EC tablet Take 1 tablet (81 mg total) by mouth daily. 30 tablet 11  . atorvastatin (LIPITOR) 80 MG tablet Take 0.5 tablets (40 mg total) by mouth every Monday, Wednesday, and Friday. Take 1 tablet (80 mg total) Sunday, Tuesday, Thursday, and Saturday. 30 tablet 6  . carvedilol (COREG) 3.125 MG tablet Take 1 tablet (3.125 mg total) by mouth 2 (two) times daily with a meal. 60 tablet 6  . nitroGLYCERIN (NITROSTAT) 0.4 MG SL tablet Place 1 tablet (0.4  mg total) under the tongue every 5 (five) minutes as needed for chest pain (up to 3 doses). 25 tablet 3  . ramipril (ALTACE) 2.5 MG capsule Take 1 capsule (2.5 mg total) by mouth 2 (two) times daily. 60 capsule 6  . ticagrelor (BRILINTA) 90 MG TABS tablet Take 1 tablet (90 mg total) by mouth 2 (two) times daily. 40 tablet 0   No current facility-administered medications for this visit.    Allergies:   Review of patient's allergies indicates no known allergies.    Social History:  The patient  reports that she has been smoking Cigarettes.  She has a 30 pack-year smoking history. She has never used smokeless tobacco. She reports that she does not drink alcohol or use illicit drugs.   Family History:  The patient'sfamily history includes Liver disease in her mother; Stroke in her father.    ROS:  Please see the history of present illness.   Otherwise, review of systems are positive for none.   All other systems are reviewed and negative.    PHYSICAL EXAM: VS:  BP 180/80 mmHg  Pulse 72  Ht 5' 6.5" (1.689 m)  Wt 104 lb 12.8 oz (47.537 kg)  BMI 16.66 kg/m2 , BMI Body mass index is 16.66 kg/(m^2). GEN: Well nourished, well developed, in no acute distress. Elderly  HEENT: normal Neck: no JVD, carotid bruits, or masses Cardiac: RRR; + SEM, rubs, or gallops,no edema  Respiratory:  clear to auscultation bilaterally, normal work of breathing GI: soft, nontender, nondistended, + BS MS: no deformity or atrophy Skin: warm and dry, no rash Neuro:  Strength and sensation are intact Psych: euthymic mood, full affect   EKG:  EKG is not ordered today.  Recent Labs: 09/12/2014: B Natriuretic Peptide 129.9*; TSH 4.450 09/14/2014: Hemoglobin 12.9; Platelets 186 09/22/2014: BUN 18; Creatinine, Ser 0.75; Potassium 4.0; Sodium 141 10/26/2014: ALT 59*    Lipid Panel    Component Value Date/Time   CHOL 80* 10/26/2014 0956   TRIG 38 10/26/2014 0956   HDL 33* 10/26/2014 0956   CHOLHDL 2.4 10/26/2014  0956   VLDL 8 10/26/2014 0956   LDLCALC 39 10/26/2014 0956      Wt Readings from Last 3 Encounters:  01/22/15 104 lb 12.8 oz (47.537 kg)  10/07/14 118 lb (53.524 kg)  09/22/14 102 lb (46.267 kg)     Other studies Reviewed:  Additional studies/ records that were reviewed today include: ECHO, LHC  Review of the above records demonstrates:  Cardiac cath  Mid RCA to Dist RCA lesion, 100% stenosed.  Mid Cx to Dist Cx lesion, 50% stenosed.  Ramus lesion, 65% stenosed.  Prox LAD to Mid LAD lesion, 100% stenosed. There is a 0% residual stenosis post intervention.  A drug-eluting stent was placed.  Acute anterior myocardial infarction due to occlusion of the proximal LAD  Successful LAD recannalization and placement of a drug-eluting stent reducing a 100% stenosis to 0% with TIMI grade 3 flow.  Chronic total occlusion of the mid right coronary. The right coronary is collateralized right to right and from left-to-right.  Mild-to-moderate circumflex/obtuse marginal disease as noted above.  Inferior and apical mild hypokinesis. Overall LVEF 50%.    ECHO: 09/14/14  Study Conclusions  - Left ventricle: The cavity size was normal. Wall thickness was  increased in a pattern of mild LVH. Systolic function was mildly  reduced. The estimated ejection fraction was in the range of 45%  to 50%. There is hypokinesis of the midinferior myocardium.  Doppler parameters are consistent with abnormal left ventricular  relaxation (grade 1 diastolic dysfunction). Doppler parameters  are consistent with high ventricular filling pressure.  - Aortic valve: Valve mobility was restricted. There was mild  stenosis. Peak velocity (S): 244 cm/s. Mean gradient (S): 12 mm  Hg. Valve area (VTI): 1.29 cm^2. Valve area (Vmax): 1.07 cm^2.  Valve area (Vmean): 1.07 cm^2.  - Mitral valve: There was mild regurgitation.  - Right ventricle: The cavity size was mildly dilated. Wall  thickness was normal.    - Right atrium: The atrium was moderately dilated.  Impressions:  - When compared to prior, EF is reduced, inferior wall motion  abnormality is new.    ASSESSMENT AND PLAN:  Joanne Thompson is a 82 y.o. female with a history of CAD s/p DES to LAD, continued tobacco abuse, pulmonary nodule, HTN, ischemic CM, mild AS and dementia who presents to clinic for follow up.   Anterior STEMI/CAD s/p DES to LAD: chronic total occlusion of mRCA with R-R/L-R collaterals, moderate Cx disease. Stable, no further chest pain. -- Continue ASA/Brilinta, Coreg 3.125mg  BID, atorvastatin 80mg , Altace 2.5mg  qd. Samples of Brilinta given. They are applying for patient assistance as her copay is ~$300.  Mild ICM: EF 50% with inferior/apical mild HK by cath, 45-50% by echo  -- Continue Altace 2.5 mg BID and Coreg 3.125mg  BID.  -- She appears euvolemic.  Essential HTN: BP elevated today to 180/80. She is on Coreg 3.125mg  BID and Altace 2.5 mg daily. Her HR is 72. I will increase her Coreg to 6.25mg  BID. I offered them an appt in the HTN clinic next week but they said this would be too difficult. She will take her BP when the snow clears ~ Monday at a drug store and call us with her BP. If still elevated we can increase her Altace.   H/o memory impairment: son with her.   Pulmonary Nodule: followed by Dr. Tyrone Sage. Highly suspicious for malignancy with long history of tobacco abuse. Poor surgical candidate. Offered PET scan, but patient declines   HLD: continue high dose statin.   DM: HgA1c was 6.5. She does need PCP for further management.   Continued tobacco abuse: not interested in quitting  Current medicines are reviewed at length with the patient today.  The patient does not have concerns regarding medicines.  The following changes have been made:  no change  Labs/ tests ordered today include:  No orders of the defined types were placed in this encounter.    Disposition:   FU with Dr. Herbie Baltimore in  2-3 months  Signed, Allena Katz  01/22/2015 2:51 PM    [facility] [address] [place], [place], [place]  [address] Phone: [phone]; Fax: [phone]

## 2015-01-21 NOTE — Telephone Encounter (Signed)
Patient calling the office for samples of medication:   1.  What medication and dosage are you requesting samples for?Berlanta 90mg   2.  Are you currently out of this medication? almost

## 2015-01-22 ENCOUNTER — Ambulatory Visit (INDEPENDENT_AMBULATORY_CARE_PROVIDER_SITE_OTHER): Payer: Medicare Other | Admitting: Physician Assistant

## 2015-01-22 ENCOUNTER — Encounter: Payer: Self-pay | Admitting: Physician Assistant

## 2015-01-22 VITALS — BP 180/80 | HR 72 | Ht 66.5 in | Wt 104.8 lb

## 2015-01-22 DIAGNOSIS — I2510 Atherosclerotic heart disease of native coronary artery without angina pectoris: Secondary | ICD-10-CM | POA: Diagnosis not present

## 2015-01-22 MED ORDER — CARVEDILOL 6.25 MG PO TABS: 6.2500 mg | ORAL_TABLET | Freq: Two times a day (BID) | ORAL | Status: AC

## 2015-01-22 NOTE — Patient Instructions (Addendum)
Medication Instructions:  Your physician has recommended you make the following change in your medication:  1- Increase Coreg to 6.25 mg by mouth twice daily.   Labwork: NONE  Testing/Procedures: NONE  Follow-Up: Your physician wants you to follow-up in: 2 to 3  months with Dr. Herbie Baltimore.    If you need a refill on your cardiac medications before your next appointment, please call your pharmacy.

## 2015-02-01 ENCOUNTER — Encounter: Payer: Self-pay | Admitting: Physician Assistant

## 2015-02-01 ENCOUNTER — Telehealth: Payer: Self-pay | Admitting: Cardiology

## 2015-02-01 NOTE — Telephone Encounter (Signed)
Called pt's son. Reports BP of 128/70 today. HR 70. Had been instructed to recheck post- medication adjustment.  Informed him I would fwd to Philomena Course to see if anything further recommended.

## 2015-02-01 NOTE — Telephone Encounter (Signed)
Pt verbalized understanding.

## 2015-02-01 NOTE — Telephone Encounter (Signed)
Can  You let them know that this pressure looks excellent. Lets keep her on all the same meds. No change in plants

## 2015-02-01 NOTE — Telephone Encounter (Signed)
New Message  Pt son calling to give BP reading for today 1/16 per Philomena Course- 128/70- Please call back and discuss.

## 2015-03-30 ENCOUNTER — Ambulatory Visit: Payer: Medicare Other | Admitting: Cardiology

## 2015-04-28 ENCOUNTER — Ambulatory Visit: Payer: Medicare Other | Admitting: Cardiology

## 2015-05-20 ENCOUNTER — Ambulatory Visit: Payer: Medicare Other | Admitting: Cardiology

## 2015-05-28 ENCOUNTER — Other Ambulatory Visit: Payer: Self-pay

## 2015-05-28 DIAGNOSIS — I2109 ST elevation (STEMI) myocardial infarction involving other coronary artery of anterior wall: Secondary | ICD-10-CM

## 2015-05-28 DIAGNOSIS — R9389 Abnormal findings on diagnostic imaging of other specified body structures: Secondary | ICD-10-CM

## 2015-05-28 MED ORDER — RAMIPRIL 2.5 MG PO CAPS: 2.5000 mg | ORAL_CAPSULE | Freq: Two times a day (BID) | ORAL | Status: DC

## 2015-06-15 ENCOUNTER — Encounter: Payer: Self-pay | Admitting: Cardiology

## 2015-06-15 ENCOUNTER — Ambulatory Visit (INDEPENDENT_AMBULATORY_CARE_PROVIDER_SITE_OTHER): Payer: Medicare Other | Admitting: Cardiology

## 2015-06-15 ENCOUNTER — Telehealth: Payer: Self-pay | Admitting: Cardiology

## 2015-06-15 VITALS — BP 111/66 | HR 59 | Ht 66.5 in | Wt 100.8 lb

## 2015-06-15 DIAGNOSIS — R413 Other amnesia: Secondary | ICD-10-CM

## 2015-06-15 DIAGNOSIS — I2510 Atherosclerotic heart disease of native coronary artery without angina pectoris: Secondary | ICD-10-CM

## 2015-06-15 DIAGNOSIS — I255 Ischemic cardiomyopathy: Secondary | ICD-10-CM | POA: Diagnosis not present

## 2015-06-15 DIAGNOSIS — I10 Essential (primary) hypertension: Secondary | ICD-10-CM

## 2015-06-15 DIAGNOSIS — Z9861 Coronary angioplasty status: Secondary | ICD-10-CM

## 2015-06-15 DIAGNOSIS — I350 Nonrheumatic aortic (valve) stenosis: Secondary | ICD-10-CM | POA: Diagnosis not present

## 2015-06-15 DIAGNOSIS — I2102 ST elevation (STEMI) myocardial infarction involving left anterior descending coronary artery: Secondary | ICD-10-CM | POA: Diagnosis not present

## 2015-06-15 MED ORDER — TICAGRELOR 90 MG PO TABS: 90.0000 mg | ORAL_TABLET | Freq: Two times a day (BID) | ORAL | Status: AC

## 2015-06-15 NOTE — Telephone Encounter (Signed)
Patient seeing Dr. Herbie Baltimore this AM.  RN to provide samples if appropriate

## 2015-06-15 NOTE — Assessment & Plan Note (Signed)
No active heart failure symptoms. No worsening exertional dyspnea than baseline.

## 2015-06-15 NOTE — Assessment & Plan Note (Signed)
She has stents in the proximal LAD drug-eluting stent. Remains on aspirin plus Brilinta. At one year allergies consider switching to 60 mg Brilinta. We can stop aspirin a time if necessary for bleeding issues. I'm not sure how active she is sores any type of exertion to assess for any exertional symptoms with the other existing disease. Neck signed the plan is to treat existing nonobstructive disease medically. I would not consider CTO PCI of the RCA unless symptoms warrant it.  She is on atorvastatin with alternating dosing., ACE inhibitor at low-dose and moderate dose carvedilol. No active/recurrent symptoms.

## 2015-06-15 NOTE — Assessment & Plan Note (Signed)
Her ex-daughter-in-law and step-grandson are both very concerned with her well-being. She has lost a significant amount of weight now and there seems to be some issues with personal hygiene. She is currently living with a son in a rental apartment owned by the step grandson. Apparently the son that is her "caregiver "is having some health issues as well. There is concern that he may not be able to attend to her needs. The ex-daughter-in-law has a picture her ankles and feet showing diffuse dry scaly skin with swelling and redness as well as very poor toenail hygiene there taking her to a podiatrist for this.  There is a fragment of the family who is potentially somehow associated with social work and recommended that she be evaluated for her class of dementia. I think she needs a primary care physician who we are going to hopefully help her find. We are going to consult [facility] to see if they can assist with any potential home health evaluation for her health/safety and well-being. May need to consider social services.

## 2015-06-15 NOTE — Assessment & Plan Note (Signed)
Well-controlled to borderline low for her age. Continue current dose of Carvedilol and Ramipril.

## 2015-06-15 NOTE — Telephone Encounter (Signed)
New message     Patient calling the office for samples of medication:   1.  What medication and dosage are you requesting samples for? Brilinta 90 mg po twice daily  2.  Are you currently out of this medication? The pt is there for a appointment, the son is calling for the pt.

## 2015-06-15 NOTE — Patient Instructions (Addendum)
NO CHANGES IN MEDICATIONS  [facility] WILL CONTACT YOU IN  HOME CARE-- REGULAR DAILY LIVING ACTIVITY  Your physician wants you to follow-up in 3 MONTHS WITH DR HARDING. You will receive a reminder letter in the mail two months in advance. If you don't receive a letter, please call our office to schedule the follow-up appointment.

## 2015-06-15 NOTE — Assessment & Plan Note (Signed)
She had an occluded LAD in the setting of a chronically occluded RCA. Now status post PCI. EF seems to be relatively stable. No recurrent anginal symptoms or heart failure symptoms.

## 2015-06-15 NOTE — Assessment & Plan Note (Signed)
At this point, I think we would probably recheck an echocardiogram about a year out from her MI to reassess EF and the aortic valve. It is stable then we may just follow up based on the severity of the valve. I don't think she would be a good candidate for valvular placement repair. However with only mild stenosis with would probably have time to monitor.

## 2015-06-15 NOTE — Progress Notes (Signed)
PCP: No primary care provider on file.  Clinic Note: Chief Complaint  Patient presents with  . Follow-up    Pt states no Sx.  . Coronary Artery Disease  . Dementia    HPI: Joanne Thompson is a 82 y.o. female with a PMH below who presents today for 8 months follow-up for anterior STEMI back in August 2016. She had emergent catheterization revealing 100% occluded RCA and that was old and 100% occluded LAD new. Had DES to mid LAD - by Dr. Verdis Thompson. She is accompanied today by her ex-daughter-in-law (the ex-wife of one of her sons who died years ago)  Joanne Thompson was seen on 09/22/2014 but Joanne Boozer, NP-C. Then she saw Joanne Crock, PA on 01/22/2015. --Her Coreg was increased to 6.25 mg twice a day.  Recent Hospitalizations: None since her MI; Since her hospitalization, she has moved houses to a rental apartment owned by herself step grandson (the son of the lady who is escorting her today.) She lives with one of her sons who apparently has his own medical issues.  Studies Reviewed: From hospitalization for MI; PSH/PMH updated  Procedures  - 09/12/2014   Coronary Stent Intervention   Left Heart Cath and Coronary Angiography    Conclusion     Mid RCA to Dist RCA lesion, 100% stenosed. CTO with R-R & L-R collaterals.  Mid Cx to Dist Cx lesion, 50% stenosed.  Ramus lesion, 65% stenosed.  Prox LAD to Mid LAD lesion, 100% stenosed. There is a 0% residual stenosis post intervention.  A drug-eluting stent was placed. Promus Premier DES 3.0 mm x 20 mm (  Inferior and apical mild hypokinesis. Overall LVEF 50%.      2 D Echo 09/14/2014:  Mild LVH. EF 45-50% (mildly reduced). Hypokinesis of mid inferior wall. GR 1 DD with high filling pressures. Mild AS (mean gradient 12 mmHg, AVA~1.07 cm^2). Mild MR. Mild RV dilation. Moderate RA dilation   Interval History: Joanne Thompson presents today for her follow-up visit, without any significant complaints of chest tightness  or pressure with rest or exertion consistent with her angina. She has some mild edema, but no PND or orthopnea. With the extent of exertion that she does, she is not noticing any heart failure or anginal symptoms. He is not however very active. Unfortunately she is a very poor historian, and she is accompanied by her ex-daughter-in-law, who is seeing her for the first time in years as a favor to family helping her to come to the visit. The patient really denies any significant symptoms, partially because she doesn't remember anything. She has baseline dyspnea but no significant cough or wheezing from her long-term smoking. Has not had a diagnosis of COPD, however her body habitus would suggest it. She's not had any melena, hematochezia or hematuria. No nosebleeds. As far as I can tell no sensation of any syncope/near-syncope or TIA since summer versus fugax symptoms. She does have some orthostatic dizziness. She denies any claudication.   ROS: A comprehensive was performed. Review of Systems  Constitutional: Positive for weight loss (not intentionally.  not really changed anything.).  HENT: Negative for congestion and nosebleeds.   Respiratory: Positive for cough (occasionally) and wheezing. Negative for shortness of breath.   Cardiovascular: Negative for chest pain, palpitations, orthopnea, claudication, leg swelling and PND.  Gastrointestinal: Positive for diarrhea (Had diarrhea a couple weeks ago). Negative for heartburn, constipation, blood in stool and melena.  Genitourinary: Negative for hematuria.  Musculoskeletal: Positive for  myalgias (occasional leg cramps).  Skin: Positive for rash (Dry, scaly skin with red undertone).  Neurological: Negative for dizziness, loss of consciousness and headaches.  Endo/Heme/Allergies: Bruises/bleeds easily.  Psychiatric/Behavioral: Positive for memory loss (Progressively worse). Negative for depression. The patient is not nervous/anxious and does not have  insomnia.   All other systems reviewed and are negative.  -- The ex-daughter-in-law describes concerns about memory, and her ability to care for herself as well as the son's ability to care for her. She is not sure as to how well she is taking the medications. She is also not sure how active the patient is. She and her son (the step grandson have made arrangements to take her to a podiatrist to help with her foot care. Unfortunately, she does not currently have a PCP listed. There is a friend of the family who may potentially be a Child psychotherapist who had several questions about mental status evaluation cognitive skills etc. I think all of this should probably be done by PCP and not a cardiologist. We have given her a card of a few local primary care offices to contact in order to establish a PCP.  Past Medical History  Diagnosis Date  . ST elevation (STEMI) myocardial infarction involving left anterior descending coronary artery (HCC) 09/12/2014    CTO of RCA with new 100% p-mLAD  . CAD S/P percutaneous coronary angioplasty August 2016    a. Anterior STEMI 08/2014 - DES PCI to p-mLAD CAD - ; chronic total occlusion of mRCA with R-R/L-R collaterals, moderate Cx disease. EF 50% with inferior/apical mild HK.  . Mild aortic stenosis by prior echocardiogram August 2016    a. Mild AS (mean gradient 12 mmHg, AVA~1.07 cm^2)  . NSVT (nonsustained ventricular tachycardia) (HCC)     a. One run of NSVT on 09/12/14, possibly 2/2 reperfusion.  . Ischemic cardiomyopathy     a. Mild - cath 08/2014 showing EF 50% with inferior/apical mild HK, EF 45-50% by echo.  . Essential hypertension   . Dementia   . Hyperglycemia, unspecified     No diagnosis of diabetes  . Degenerative arthritis of lumbar spine     Past Surgical History  Procedure Laterality Date  . Cardiac surgery      pericardial window  . Appendectomy    . Cardiac catheterization N/A 09/12/2014    Procedure: Left Heart Cath and Coronary Angiography;   Surgeon: Lyn Records, MD;  Location: [facility] INVASIVE CV LAB; culprit = 100% pLAD --> PCI, mRCA CTO 100% (R-R & L-R collaterals), Mild-Mod (50%) dCx, Mod  65% RI. Inferior & apical HK - EF ~50%.  . Cardiac catheterization N/A 09/12/2014    Procedure: Coronary Stent Intervention;  Surgeon: Lyn Records, MD;  Location: [facility] INVASIVE CV LAB;  Service: Cardiovascular;  Promus Premier DES 3.0 mm x 20 mm p-mLAD  . Transthoracic echocardiogram  09/14/2014     Mild LVH. EF 45-50% (mildly reduced). Hypokinesis of mid inferior wall. GR 1 DD with high filling pressures. Mild AMS (mean gradient 12 mmHg, AVA~1.07 cm^2). Mild MR. Mild RV dilation. Moderate RA dilation    No Known Allergies Prior to Admission medications   Medication Sig Start Date End Date Taking? Authorizing Provider  acetaminophen (TYLENOL) 325 MG tablet Take 650 mg by mouth every 6 (six) hours as needed for mild pain.   Yes Historical Provider, MD  aspirin EC 81 MG EC tablet Take 1 tablet (81 mg total) by mouth daily. 09/15/14  Yes Dayna  N Dunn, PA-C  atorvastatin (LIPITOR) 80 MG tablet Take 0.5 tablets (40 mg total) by mouth every Monday, Wednesday, and Friday. Take 1 tablet (80 mg total) Sunday, Tuesday, Thursday, and Saturday. 10/30/14  Yes Lyn Records, MD  carvedilol (COREG) 6.25 MG tablet Take 1 tablet (6.25 mg total) by mouth 2 (two) times daily with a meal. 01/22/15  Yes Janetta Hora, PA-C  nitroGLYCERIN (NITROSTAT) 0.4 MG SL tablet Place 1 tablet (0.4 mg total) under the tongue every 5 (five) minutes as needed for chest pain (up to 3 doses). 09/14/14  Yes Dayna N Dunn, PA-C  ramipril (ALTACE) 2.5 MG capsule Take 1 capsule (2.5 mg total) by mouth 2 (two) times daily. 05/28/15  Yes Marykay Lex, MD  ticagrelor (BRILINTA) 90 MG TABS tablet Take 1 tablet (90 mg total) by mouth 2 (two) times daily. 11/25/14  Yes Chilton Si, MD     Social History   Social History  . Marital Status: Widowed    Spouse Name: N/A  . Number of  Children: N/A  . Years of Education: N/A   Social History Main Topics  . Smoking status: Current Every Day Smoker -- 0.50 packs/day for 60 years    Types: Cigarettes  . Smokeless tobacco: Never Used     Comment: a pack day all her life, will try to quit   . Alcohol Use: No  . Drug Use: No  . Sexual Activity: No   Other Topics Concern  . None   Social History Narrative   The patient currently lives with her son. Unfortunately they lost their home, so they're now living in the rental home of her step-grandson (he is the son of a deceased son's ex-wife)   The step grandsons name is Gershon Crane telephone number [phone].    family history includes Liver disease in her mother; Stroke in her father.   Wt Readings from Last 3 Encounters:  06/15/15 100 lb 12.8 oz (45.723 kg)  01/22/15 104 lb 12.8 oz (47.537 kg)  10/07/14 118 lb (53.524 kg)    PHYSICAL EXAM BP 111/66 mmHg  Pulse 59  Ht 5' 6.5" (1.689 m)  Wt 100 lb 12.8 oz (45.723 kg)  BMI 16.03 kg/m2 General appearance: alert, cooperative, appears stated age, no distress; Thin, frail - appears malnourished HEENT: [place]/AT, EOMI, MMM, anicteric sclera -- temporal wasting noted Neck: no adenopathy, no carotid bruit and no JVD -- radiating aortic murmur; normal carotid upstroke Lungs: CTA B with the most part, with diffuse mild interstitial sounds and occasional wheezing., normal percussion bilaterally and non-labored Heart: RRR with normal S1 and S2. 2-3/6 harsh C-D SEM at RUSB radiating to carotids. Otherwise no M/R/G. Abdomen: soft, non-tender; bowel sounds normal; no masses,  no organomegaly; no HJR Extremities: extremities normal, atraumatic, no cyanosis, and 1+ edema; she does have mild to moderate kyphosis in the thoracic spine Pulses: 2+ and symmetric;  Skin: Thin, frail skin. The feet and ankles that is dry and scaly with mild erythema but no warmth. Suggest possible venous stasis. Pictures of her feet reveal very poor  toenail hygiene with long curved thickened toenails suggestive of onychomycosis or No significant varicosities noted but mild edema Neurologic: Mental status: Alert, oriented to person and place, but not necessarily time. She does not know her exact location, but knows that she is in [place]. She knows that she lives in [place], but did not recall having moved to a new house home. Cranial nerves: normal (II-XII grossly intact)  Adult ECG Report  Rate: 59 ;  Rhythm: sinus bradycardia and RBBB, likely LVH with repolarization changes, but otherwise nonspecific ST and T-wave inversions in V3 and V4. Also mild Q waves in V1 and V2. This would be suggestive of anteroseptal infarct, age undetermined. Biatrial enlargement.;   Narrative Interpretation: Overall notably improved EKG from prior EKGs with evolving/resolving anteroseptal ST and T-wave changes.   Other studies Reviewed: Additional studies/ records that were reviewed today include:  Recent Labs:  Lab Results  Component Value Date   CHOL 80* 10/26/2014   HDL 33* 10/26/2014   LDLCALC 39 10/26/2014   TRIG 38 10/26/2014   CHOLHDL 2.4 10/26/2014     ASSESSMENT / PLAN: Problem List Items Addressed This Visit    ST elevation (STEMI) myocardial infarction involving left anterior descending coronary artery (HCC) (Chronic)    She had an occluded LAD in the setting of a chronically occluded RCA. Now status post PCI. EF seems to be relatively stable. No recurrent anginal symptoms or heart failure symptoms.      Mild aortic stenosis by prior echocardiogram (Chronic)    At this point, I think we would probably recheck an echocardiogram about a year out from her MI to reassess EF and the aortic valve. It is stable then we may just follow up based on the severity of the valve. I don't think she would be a good candidate for valvular placement repair. However with only mild stenosis with would probably have time to monitor.      Memory  impairment (Chronic)    Her ex-daughter-in-law and step-grandson are both very concerned with her well-being. She has lost a significant amount of weight now and there seems to be some issues with personal hygiene. She is currently living with a son in a rental apartment owned by the step grandson. Apparently the son that is her "caregiver "is having some health issues as well. There is concern that he may not be able to attend to her needs. The ex-daughter-in-law has a picture her ankles and feet showing diffuse dry scaly skin with swelling and redness as well as very poor toenail hygiene there taking her to a podiatrist for this.  There is a fragment of the family who is potentially somehow associated with social work and recommended that she be evaluated for her class of dementia. I think she needs a primary care physician who we are going to hopefully help her find. We are going to consult [facility] to see if they can assist with any potential home health evaluation for her health/safety and well-being. May need to consider social services.       Relevant Orders   EKG 12-Lead   AMB Referral to [facility] Care Management   Ischemic cardiomyopathy, mild (Chronic)    No active heart failure symptoms. No worsening exertional dyspnea than baseline.      Essential hypertension (Chronic)    Well-controlled to borderline low for her age. Continue current dose of Carvedilol and Ramipril.      Relevant Orders   EKG 12-Lead   AMB Referral to [facility] Care Management   CAD S/P percutaneous coronary angioplasty - Primary (Chronic)    She has stents in the proximal LAD drug-eluting stent. Remains on aspirin plus Brilinta. At one year allergies consider switching to 60 mg Brilinta. We can stop aspirin a time if necessary for bleeding issues. I'm not sure how active she is sores any type of exertion to assess for any exertional symptoms  with the other existing disease. Neck signed the plan is to treat existing  nonobstructive disease medically. I would not consider CTO PCI of the RCA unless symptoms warrant it.  She is on atorvastatin with alternating dosing., ACE inhibitor at low-dose and moderate dose carvedilol. No active/recurrent symptoms.      Relevant Orders   EKG 12-Lead   AMB Referral to [facility] Care Management      Current medicines are reviewed at length with the patient today. (+/- concerns) None The following changes have been made: No medication changes Studies Ordered:   Orders Placed This Encounter  Procedures  . AMB Referral to [facility] Care Management  . EKG 12-Lead   This patient was a new patient to me, having seen a physician extenders and Dr. Katrinka Blazing while in the hospital. She has a concave past palpitation, but also is a very difficult historian. Much of the history is provided by her ex-daughter-in-law. After close to 30 minutes of direct patient contact, I then spent another 15+ minutes with the daughter-in-law discussing her concerns for the patient's well-being.  Greater than 50% of the time with the patient and daughter-in-law was revolving around counseling and discussing options for trying to provide care for the patient.   She will follow-up in 3 minutes.   Bryan Lemma, M.D., M.S. Interventional Cardiologist   Pager # [phone] Phone # [phone] [address] [place], [place] [address]

## 2015-06-16 ENCOUNTER — Telehealth: Payer: Self-pay | Admitting: Pharmacist

## 2015-06-16 NOTE — Telephone Encounter (Signed)
Pt's son calling about Brilinta samples. He states that he has not yet mailed paperwork for [place] patient assistance, but does have it and it is completed. He states he will put it in the mail today. He also states she has a 4 day supply from visit yesterday. Will leave additional samples at the front desk for patient.

## 2015-06-17 DIAGNOSIS — L853 Xerosis cutis: Secondary | ICD-10-CM | POA: Diagnosis not present

## 2015-06-17 DIAGNOSIS — L97519 Non-pressure chronic ulcer of other part of right foot with unspecified severity: Secondary | ICD-10-CM | POA: Diagnosis not present

## 2015-06-19 DIAGNOSIS — I872 Venous insufficiency (chronic) (peripheral): Secondary | ICD-10-CM | POA: Diagnosis not present

## 2015-06-19 DIAGNOSIS — L97511 Non-pressure chronic ulcer of other part of right foot limited to breakdown of skin: Secondary | ICD-10-CM | POA: Diagnosis not present

## 2015-06-21 DIAGNOSIS — L97511 Non-pressure chronic ulcer of other part of right foot limited to breakdown of skin: Secondary | ICD-10-CM | POA: Diagnosis not present

## 2015-06-21 DIAGNOSIS — I872 Venous insufficiency (chronic) (peripheral): Secondary | ICD-10-CM | POA: Diagnosis not present

## 2015-06-23 DIAGNOSIS — L97511 Non-pressure chronic ulcer of other part of right foot limited to breakdown of skin: Secondary | ICD-10-CM | POA: Diagnosis not present

## 2015-06-23 DIAGNOSIS — I872 Venous insufficiency (chronic) (peripheral): Secondary | ICD-10-CM | POA: Diagnosis not present

## 2015-06-25 DIAGNOSIS — I872 Venous insufficiency (chronic) (peripheral): Secondary | ICD-10-CM | POA: Diagnosis not present

## 2015-06-25 DIAGNOSIS — L97511 Non-pressure chronic ulcer of other part of right foot limited to breakdown of skin: Secondary | ICD-10-CM | POA: Diagnosis not present

## 2015-06-28 DIAGNOSIS — L97519 Non-pressure chronic ulcer of other part of right foot with unspecified severity: Secondary | ICD-10-CM | POA: Diagnosis not present

## 2015-06-28 DIAGNOSIS — L97511 Non-pressure chronic ulcer of other part of right foot limited to breakdown of skin: Secondary | ICD-10-CM | POA: Diagnosis not present

## 2015-06-28 DIAGNOSIS — I872 Venous insufficiency (chronic) (peripheral): Secondary | ICD-10-CM | POA: Diagnosis not present

## 2015-06-28 DIAGNOSIS — L853 Xerosis cutis: Secondary | ICD-10-CM | POA: Diagnosis not present

## 2015-06-29 ENCOUNTER — Ambulatory Visit (INDEPENDENT_AMBULATORY_CARE_PROVIDER_SITE_OTHER)
Admission: RE | Admit: 2015-06-29 | Discharge: 2015-06-29 | Disposition: A | Discharge status: Home / Self Care | Payer: Medicare Other | Source: Ambulatory Visit | Attending: Family Medicine | Admitting: Family Medicine

## 2015-06-29 ENCOUNTER — Encounter: Payer: Self-pay | Admitting: Family Medicine

## 2015-06-29 ENCOUNTER — Other Ambulatory Visit: Payer: Self-pay | Admitting: Family Medicine

## 2015-06-29 ENCOUNTER — Ambulatory Visit (INDEPENDENT_AMBULATORY_CARE_PROVIDER_SITE_OTHER): Payer: Medicare Other | Admitting: Family Medicine

## 2015-06-29 VITALS — BP 112/78 | HR 67 | Temp 97.9°F | Resp 12 | Ht 63.0 in | Wt 99.1 lb

## 2015-06-29 DIAGNOSIS — J449 Chronic obstructive pulmonary disease, unspecified: Secondary | ICD-10-CM

## 2015-06-29 DIAGNOSIS — R071 Chest pain on breathing: Secondary | ICD-10-CM

## 2015-06-29 DIAGNOSIS — F0391 Unspecified dementia with behavioral disturbance: Secondary | ICD-10-CM | POA: Diagnosis not present

## 2015-06-29 DIAGNOSIS — R739 Hyperglycemia, unspecified: Secondary | ICD-10-CM

## 2015-06-29 DIAGNOSIS — I255 Ischemic cardiomyopathy: Secondary | ICD-10-CM

## 2015-06-29 DIAGNOSIS — R2681 Unsteadiness on feet: Secondary | ICD-10-CM

## 2015-06-29 DIAGNOSIS — R413 Other amnesia: Secondary | ICD-10-CM

## 2015-06-29 DIAGNOSIS — F03918 Unspecified dementia, unspecified severity, with other behavioral disturbance: Secondary | ICD-10-CM

## 2015-06-29 DIAGNOSIS — R0781 Pleurodynia: Secondary | ICD-10-CM

## 2015-06-29 DIAGNOSIS — F419 Anxiety disorder, unspecified: Secondary | ICD-10-CM

## 2015-06-29 DIAGNOSIS — I10 Essential (primary) hypertension: Secondary | ICD-10-CM

## 2015-06-29 DIAGNOSIS — Z9181 History of falling: Secondary | ICD-10-CM

## 2015-06-29 LAB — CBC WITH DIFFERENTIAL/PLATELET
BASOS ABS: 0 10*3/uL (ref 0.0–0.1)
Basophils Relative: 0 % (ref 0.0–3.0)
EOS ABS: 0 10*3/uL (ref 0.0–0.7)
Eosinophils Relative: 0.3 % (ref 0.0–5.0)
HCT: 35.1 % — ABNORMAL LOW (ref 36.0–46.0)
HEMOGLOBIN: 11.1 g/dL — AB (ref 12.0–15.0)
Lymphocytes Relative: 17.1 % (ref 12.0–46.0)
Lymphs Abs: 1.2 10*3/uL (ref 0.7–4.0)
MCHC: 31.7 g/dL (ref 30.0–36.0)
MCV: 80.9 fl (ref 78.0–100.0)
MONO ABS: 0.4 10*3/uL (ref 0.1–1.0)
Monocytes Relative: 6.3 % (ref 3.0–12.0)
NEUTROS PCT: 76.3 % (ref 43.0–77.0)
Neutro Abs: 5.3 10*3/uL (ref 1.4–7.7)
Platelets: 295 10*3/uL (ref 150.0–400.0)
RBC: 4.34 Mil/uL (ref 3.87–5.11)
RDW: 15.7 % — ABNORMAL HIGH (ref 11.5–15.5)
WBC: 7 10*3/uL (ref 4.0–10.5)

## 2015-06-29 LAB — COMPREHENSIVE METABOLIC PANEL
ALBUMIN: 3.6 g/dL (ref 3.5–5.2)
ALK PHOS: 106 U/L (ref 39–117)
ALT: 12 U/L (ref 0–35)
AST: 17 U/L (ref 0–37)
BUN: 28 mg/dL — AB (ref 6–23)
CO2: 27 mEq/L (ref 19–32)
CREATININE: 0.76 mg/dL (ref 0.40–1.20)
Calcium: 8.8 mg/dL (ref 8.4–10.5)
Chloride: 103 mEq/L (ref 96–112)
GFR: 77.31 mL/min (ref >60.00)
GLUCOSE: 229 mg/dL — AB (ref 70–99)
Potassium: 4.8 mEq/L (ref 3.5–5.1)
SODIUM: 138 meq/L (ref 135–145)
TOTAL PROTEIN: 7.4 g/dL (ref 6.0–8.3)
Total Bilirubin: 0.6 mg/dL (ref 0.2–1.2)

## 2015-06-29 LAB — VITAMIN B12: VITAMIN B 12: 274 pg/mL (ref 211–911)

## 2015-06-29 LAB — HEMOGLOBIN A1C: HEMOGLOBIN A1C: 7.4 % — AB (ref 4.6–6.5)

## 2015-06-29 MED ORDER — BUDESONIDE-FORMOTEROL FUMARATE 80-4.5 MCG/ACT IN AERO: 2.0000 | INHALATION_SPRAY | Freq: Two times a day (BID) | RESPIRATORY_TRACT | Status: DC

## 2015-06-29 MED ORDER — FLUOXETINE HCL 20 MG PO TABS: 20.0000 mg | ORAL_TABLET | Freq: Every day | ORAL | Status: DC

## 2015-06-29 MED ORDER — CAPSAICIN 0.075 % EX GEL: 1.0000 "application " | Freq: Three times a day (TID) | CUTANEOUS | Status: DC | PRN

## 2015-06-29 NOTE — Telephone Encounter (Signed)
Pt states the budesonide-formoterol (SYMBICORT) 80-4.5 MCG/ACT inhaler Was $300. Pt would like inhaler comparable that medicare will cover if possible  [facility] [address]   Also, son would like a call back with results of xrays done today asap.

## 2015-06-29 NOTE — Progress Notes (Signed)
HPI:   Joanne Thompson is a 82 y.o. female, who is here today with her son, Joanne Thompson, who is her caregiver to establish care with me. Her son has 2 complaints today: "Memory problems", rib cage pain, cough, wheezing, and anxiety among some. Son provides history today.   Former PCP: not one in many years.  Last preventive routine visit: many years ago.   She lives with son.  She needs assistance with bathing,dressing, and transfer.She has a walker and a cane, she is not using neither one today.  No falls in the past year, fall early 2016 with hip fracture.  Son is also reporting "panic attacks" and would like a "sedative" "xanax" os "something."  Son is also reporting "memory problems", son would like help at home, she has no HH and son helps her with ADL's,including bathing. Dependent IADL's. She does not drive.  Memory issues getting worse for the past year, sometimes she does not recognize family members sometimes, including her son. Moved to a new house about a month ago, so her confusion has been worse. She eats well.  She has hx of depression and anxiety. Hx of psychiatric hospitalizations or ER visits: No. Any hx of bipolar disorder: No. Suicidal thoughts: no. Insomnia: no Tobacco use: yes. Alcohol abuse: Use to drink "pretty hard" but quit a few years ago. Abnormal LFT's a few months ago. Hx of illicit drug ZOX:WRUEAVW. FHx for psychiatric disorders: son with bipolar disorder.   Medications in the past: Son is not sure.  Lab Results  Component Value Date   ALT 59* 10/26/2014   AST 57* 10/26/2014   ALKPHOS 151* 10/26/2014   BILITOT 0.7 10/26/2014   Glucose elevated at 136 about 9 months ago, no Hx of DM.   + Coughs and wheezing, both for while, no treatment, not known Hx of COPD. she has had these symptoms for years, stable. She has not had fever, chills, mental status changes. No sick contact or recent travel. Listed on her PMHx is asthma. +  Smoker.   Right rib cage pain: sudden onset yesterday, son is concerned about possible fracture when she was coughing. No falls or Hx of injuries. Pain is severe, Tylenol did not help. Exacerbated by movement and breathing. Alleviated by shallow breathing and rest. No skin lesions.    Hypertension: Currently she is on Carvedilol 6.25 mg bid and Ramipril 2.5 mg. Son is not checking BP at home.  She is taking medications as instructed, no side effects reported.  Son has not noted unusual headache, visual changes, exertional chest pain,  focal weakness, or edema. + Dyspnea, usually with exertion and associated with cough and wheeeing.    Lab Results  Component Value Date   CREATININE 0.75 09/22/2014   BUN 18 09/22/2014   NA 141 09/22/2014   K 4.0 09/22/2014   CL 108 09/22/2014   CO2 25 09/22/2014     Hx of CAD, anterior STEMI back in August 2016. S/P stenting, DES to mid LAD   09/14/14 Echo: Wall thickness was increased in a pattern of mild LVH. Systolic function was mildly   reduced. The estimated ejection fraction was in the range of 45%-  to 50%. There is hypokinesis of the midinferior myocardium. Sleeps on 3 pillows and has done so for years. Follows with cardiologists q 1-2 months.     Review of Systems  Constitutional: Negative for fever, activity change, appetite change, fatigue and unexpected weight change.  HENT:  Negative for mouth sores, nosebleeds and trouble swallowing.   Eyes: Negative for redness and visual disturbance.  Respiratory: Positive for cough, shortness of breath and wheezing.   Cardiovascular: Negative for chest pain, palpitations and leg swelling.  Gastrointestinal: Negative for nausea, vomiting and abdominal pain.       Negative for changes in bowel habits.  Genitourinary: Negative for dysuria, hematuria, decreased urine volume and difficulty urinating.  Musculoskeletal: Positive for arthralgias and gait problem.  Skin: Negative for color  change and rash.  Neurological: Negative for seizures, syncope, facial asymmetry, speech difficulty, weakness and headaches.  Hematological: Bruises/bleeds easily.  Psychiatric/Behavioral: Positive for confusion (stable). Negative for suicidal ideas, hallucinations, sleep disturbance, self-injury and agitation. The patient is nervous/anxious.       Current Outpatient Prescriptions on File Prior to Visit  Medication Sig Dispense Refill  . acetaminophen (TYLENOL) 325 MG tablet Take 650 mg by mouth every 6 (six) hours as needed for mild pain.    Marland Kitchen aspirin EC 81 MG EC tablet Take 1 tablet (81 mg total) by mouth daily. 30 tablet 11  . atorvastatin (LIPITOR) 80 MG tablet Take 0.5 tablets (40 mg total) by mouth every Monday, Wednesday, and Friday. Take 1 tablet (80 mg total) Sunday, Tuesday, Thursday, and Saturday. 30 tablet 6  . carvedilol (COREG) 6.25 MG tablet Take 1 tablet (6.25 mg total) by mouth 2 (two) times daily with a meal. 180 tablet 3  . ramipril (ALTACE) 2.5 MG capsule Take 1 capsule (2.5 mg total) by mouth 2 (two) times daily. 60 capsule 3  . ticagrelor (BRILINTA) 90 MG TABS tablet Take 1 tablet (90 mg total) by mouth 2 (two) times daily. 24 tablet 0  . nitroGLYCERIN (NITROSTAT) 0.4 MG SL tablet Place 1 tablet (0.4 mg total) under the tongue every 5 (five) minutes as needed for chest pain (up to 3 doses). (Patient not taking: Reported on 06/29/2015) 25 tablet 3   No current facility-administered medications on file prior to visit.     Past Medical History  Diagnosis Date  . ST elevation (STEMI) myocardial infarction involving left anterior descending coronary artery (HCC) 09/12/2014    CTO of RCA with new 100% p-mLAD  . CAD S/P percutaneous coronary angioplasty August 2016    a. Anterior STEMI 08/2014 - DES PCI to p-mLAD CAD - ; chronic total occlusion of mRCA with R-R/L-R collaterals, moderate Cx disease. EF 50% with inferior/apical mild HK.  . Mild aortic stenosis by prior  echocardiogram     a. Mild AS (mean gradient 12 mmHg, AVA~1.07 cm^2)  . NSVT (nonsustained ventricular tachycardia) (HCC)     a. One run of NSVT on 09/12/14, possibly 2/2 reperfusion.  . Ischemic cardiomyopathy     a. Mild - cath 08/2014 showing EF 50% with inferior/apical mild HK, EF 45-50% by echo.  . Essential hypertension   . Dementia   . Hyperglycemia, unspecified     No diagnosis of diabetes  . Degenerative arthritis of lumbar spine   . Asthma   . Depression   . High blood pressure   . High cholesterol    No Known Allergies  Family History  Problem Relation Age of Onset  . Liver disease Mother   . Stroke Father     Social History   Social History  . Marital Status: Widowed    Spouse Name: N/A  . Number of Children: N/A  . Years of Education: [employer]   Social History Main Topics  . Smoking status: Current  Every Day Smoker -- 0.50 packs/day for 60 years    Types: Cigarettes  . Smokeless tobacco: Never Used     Comment: a pack day all her life, will try to quit   . Alcohol Use: No  . Drug Use: No  . Sexual Activity: No   Other Topics Concern  . None   Social History Narrative   The patient currently lives with her son. Unfortunately they lost their home, so they're now living in the rental home of her step-grandson (he is the son of a deceased son's ex-wife)   The step grandsons name is Gershon Crane telephone number [phone].    Filed Vitals:   06/29/15 1113  BP: 112/78  Pulse: 67  Temp: 97.9 F (36.6 C)  Resp: 12    Body mass index is 17.56 kg/(m^2).  SpO2 Readings from Last 3 Encounters:  06/29/15 97%  10/07/14 99%  09/14/14 98%    Physical Exam  Nursing note and vitals reviewed. Constitutional: She appears well-developed and well-nourished. She appears distressed (mild due to rib cage pain.).  HENT:  Head: Atraumatic.  Eyes: Conjunctivae and EOM are normal. Pupils are equal, round, and reactive to light.  Neck: No JVD present. No  tracheal tenderness and no muscular tenderness present. No tracheal deviation present.  Cardiovascular: Normal rate and regular rhythm.   Murmur heard. SEM II-III/VI  RUSB. DP pulses hard to find, normal capillary refills. PT present bilateral.   Respiratory: Breath sounds normal. She is in respiratory distress (right base). She has no wheezes. She has no rales. She exhibits tenderness.  Prolonged expiration and shallow breathing due to pain.  GI: Soft. She exhibits no mass. There is no tenderness.  Musculoskeletal: She exhibits no edema.       Arms: Pain upon palpation right rib cage, exacerbated by breathing and when lying down on exam table.  Lymphadenopathy:    She has no cervical adenopathy.  Neurological: She is alert. She is disoriented (time and place). No cranial nerve deficit. Gait (slow and unstable, needs son's assistance) abnormal. Coordination normal.     Skin: Skin is warm. Ecchymosis noted. No erythema.  Ecchymosis noted on forearms, no skin lesion on right rib cage area or back.  Psychiatric: Her mood appears anxious. She is slowed. Thought content is not delusional. She is noncommunicative.      ASSESSMENT AND PLAN:     Andriana was seen today for new patient (initial visit), pain and memory loss.  Diagnoses and all orders for this visit:  Costal margin pain  Repeat x-ray was ordered today. Recommended avoiding shallow breathing, trying to breathe deep a few times per day. 4 pain management I preferred topical medications due to potential side effects from opioid medications. We will give further recommendations according to x-rays results.  -     DG Ribs Unilateral W/Chest Right; Future -     Capsaicin 0.075 % GEL; Apply 1 application topically 3 (three) times daily as needed.  Essential hypertension  Blood pressure today is on the lower normal range, no changes for now but we will monitor closely. I recommended some trying to check blood pressure at  home.  Anxiety disorder, unspecified  I explained son that at this age "sedatives" are not recommended due to side effects and risk of falls. After discussing a few treatment options, son agrees with trying Prozac, recommended starting half tablet for a week and then increase it to the whole tablet if well tolerated. Some side  effects discussed, follow up in 3 weeks.  -     FLUoxetine (PROZAC) 20 MG tablet; Take 1 tablet (20 mg total) by mouth daily.  Dementia, old age, with behavioral disturbance   Question of Alzheimer's, alcohol related, or vascular. We may consider pharmacologic options next visit. Today some labs were ordered, further recommendations would be given accordingly. We will try to arrange home health to help with ADLs.  -     Vitamin B12 -     CBC with Differential/Platelet -     Comprehensive metabolic panel -     Ambulatory referral to Home Health  Hyperglycemia  Further recommendations would be given according to hemoglobin A1c results.  -     Hemoglobin A1c  Memory impairment -     Vitamin B12  Unstable gait  She might benefit from physical therapy for retraining and straining. Home health evaluation will be arrange. Due to unstable gait is hard for her to keep office visits so we will try to arrange physical therapy at home through home health.  -     Ambulatory referral to Home Health  Risk for falls  Fall precautions discussed. Recommended using the walker. Physical therapy might help.  -     Ambulatory referral to Home Health  Chronic obstructive pulmonary disease, unspecified COPD type (HCC)  With associated asthma. She will try Symbicort twice daily, started with a low-dose. Follow-up in 3 weeks.  -     budesonide-formoterol (SYMBICORT) 80-4.5 MCG/ACT inhaler; Inhale 2 puffs into the lungs 2 (two) times daily.              Betty G. [place], MD  [facility]. [place] office.

## 2015-06-29 NOTE — Patient Instructions (Signed)
A few things to remember from today's visit:   1. Essential hypertension   2. Costal margin pain  - DG Ribs Unilateral W/Chest Right; Future  3. Anxiety disorder, unspecified  - FLUoxetine (PROZAC) 20 MG tablet; Take 1 tablet (20 mg total) by mouth daily.  Dispense: 30 tablet; Refill: 3  4. Dementia, old age, with behavioral disturbance  - Vitamin B12 - CBC with Differential/Platelet - Comprehensive metabolic panel  5. Hyperglycemia  - Hemoglobin A1c  6. Memory impairment  - Vitamin B12      If you sign-up for My chart, you can communicate easier with [place] in case you have any question or concern.

## 2015-06-30 MED ORDER — BUDESONIDE-FORMOTEROL FUMARATE 80-4.5 MCG/ACT IN AERO: 2.0000 | INHALATION_SPRAY | Freq: Two times a day (BID) | RESPIRATORY_TRACT | Status: DC

## 2015-06-30 NOTE — Telephone Encounter (Signed)
Script faxed.   [phone]

## 2015-06-30 NOTE — Telephone Encounter (Signed)
In regard to inhaler it can be changed to the one covered by her health insurance. Son can find out with pharmacists or on line or calling. Thanks, BJ

## 2015-06-30 NOTE — Telephone Encounter (Signed)
Son following up on request for another inhaler and results of xray

## 2015-06-30 NOTE — Telephone Encounter (Signed)
Spoke to pt's son, will print out RX and fax it to [facility].

## 2015-06-30 NOTE — Telephone Encounter (Signed)
Son call to say that pt is enroll [place]. Pt son said [facility] who is the maker of SYMBICORT told him she qualified for free SYMBICORT. He said ASTRAZENAZA just need a rx fax to the following number     [phone]

## 2015-07-01 DIAGNOSIS — I872 Venous insufficiency (chronic) (peripheral): Secondary | ICD-10-CM | POA: Diagnosis not present

## 2015-07-01 DIAGNOSIS — L97511 Non-pressure chronic ulcer of other part of right foot limited to breakdown of skin: Secondary | ICD-10-CM | POA: Diagnosis not present

## 2015-07-05 ENCOUNTER — Telehealth: Payer: Self-pay | Admitting: Family Medicine

## 2015-07-05 DIAGNOSIS — L97511 Non-pressure chronic ulcer of other part of right foot limited to breakdown of skin: Secondary | ICD-10-CM | POA: Diagnosis not present

## 2015-07-05 DIAGNOSIS — I872 Venous insufficiency (chronic) (peripheral): Secondary | ICD-10-CM | POA: Diagnosis not present

## 2015-07-05 NOTE — Telephone Encounter (Signed)
Pt would like to know if she can keep [facility] verses [facility]?   Frances Furbish has been working on her foot wound for 2-3 weeks.  Frances Furbish will do any orders that you have sent to [facility] for the pt.  Verbal orders are accepted you can call Shirlee Limerick @ [phone].

## 2015-07-06 NOTE — Telephone Encounter (Signed)
I was not aware of wound on her foot. It ok to start wound care through agency Ms Linders's son prefer. Also I thought son was going to bring her back before f/u appt to discussed recent CXR, which was abnormal and correlates with abnormal chest CT done in 09/2014. Son is supposed to be aware of results (according to documentation). 09/2014 chest CT reported a 4.8 cm rounded masslike opacity in the left upper lobe. If he wants to wait until next appt, which shoulder be in about 2 weeks, it is also appropriate. Thanks. BJ

## 2015-07-06 NOTE — Telephone Encounter (Signed)
Left message for Shirlee Limerick, will leave note open incase she does call back.

## 2015-07-07 DIAGNOSIS — I872 Venous insufficiency (chronic) (peripheral): Secondary | ICD-10-CM | POA: Diagnosis not present

## 2015-07-07 DIAGNOSIS — L97511 Non-pressure chronic ulcer of other part of right foot limited to breakdown of skin: Secondary | ICD-10-CM | POA: Diagnosis not present

## 2015-07-09 DIAGNOSIS — L97511 Non-pressure chronic ulcer of other part of right foot limited to breakdown of skin: Secondary | ICD-10-CM | POA: Diagnosis not present

## 2015-07-09 DIAGNOSIS — I872 Venous insufficiency (chronic) (peripheral): Secondary | ICD-10-CM | POA: Diagnosis not present

## 2015-07-13 DIAGNOSIS — I872 Venous insufficiency (chronic) (peripheral): Secondary | ICD-10-CM | POA: Diagnosis not present

## 2015-07-13 DIAGNOSIS — L97511 Non-pressure chronic ulcer of other part of right foot limited to breakdown of skin: Secondary | ICD-10-CM | POA: Diagnosis not present

## 2015-07-14 DIAGNOSIS — I872 Venous insufficiency (chronic) (peripheral): Secondary | ICD-10-CM | POA: Diagnosis not present

## 2015-07-14 DIAGNOSIS — L97511 Non-pressure chronic ulcer of other part of right foot limited to breakdown of skin: Secondary | ICD-10-CM | POA: Diagnosis not present

## 2015-07-15 DIAGNOSIS — I872 Venous insufficiency (chronic) (peripheral): Secondary | ICD-10-CM | POA: Diagnosis not present

## 2015-07-15 DIAGNOSIS — L97511 Non-pressure chronic ulcer of other part of right foot limited to breakdown of skin: Secondary | ICD-10-CM | POA: Diagnosis not present

## 2015-07-16 DIAGNOSIS — I872 Venous insufficiency (chronic) (peripheral): Secondary | ICD-10-CM | POA: Diagnosis not present

## 2015-07-16 DIAGNOSIS — L97511 Non-pressure chronic ulcer of other part of right foot limited to breakdown of skin: Secondary | ICD-10-CM | POA: Diagnosis not present

## 2015-07-17 DIAGNOSIS — I872 Venous insufficiency (chronic) (peripheral): Secondary | ICD-10-CM | POA: Diagnosis not present

## 2015-07-17 DIAGNOSIS — L97511 Non-pressure chronic ulcer of other part of right foot limited to breakdown of skin: Secondary | ICD-10-CM | POA: Diagnosis not present

## 2015-07-19 DIAGNOSIS — I872 Venous insufficiency (chronic) (peripheral): Secondary | ICD-10-CM | POA: Diagnosis not present

## 2015-07-19 DIAGNOSIS — L97511 Non-pressure chronic ulcer of other part of right foot limited to breakdown of skin: Secondary | ICD-10-CM | POA: Diagnosis not present

## 2015-07-21 ENCOUNTER — Ambulatory Visit: Payer: Medicare Other | Admitting: Family Medicine

## 2015-07-21 DIAGNOSIS — L97511 Non-pressure chronic ulcer of other part of right foot limited to breakdown of skin: Secondary | ICD-10-CM | POA: Diagnosis not present

## 2015-07-21 DIAGNOSIS — I872 Venous insufficiency (chronic) (peripheral): Secondary | ICD-10-CM | POA: Diagnosis not present

## 2015-07-22 DIAGNOSIS — L97511 Non-pressure chronic ulcer of other part of right foot limited to breakdown of skin: Secondary | ICD-10-CM | POA: Diagnosis not present

## 2015-07-22 DIAGNOSIS — I872 Venous insufficiency (chronic) (peripheral): Secondary | ICD-10-CM | POA: Diagnosis not present

## 2015-07-23 DIAGNOSIS — I872 Venous insufficiency (chronic) (peripheral): Secondary | ICD-10-CM | POA: Diagnosis not present

## 2015-07-23 DIAGNOSIS — L97511 Non-pressure chronic ulcer of other part of right foot limited to breakdown of skin: Secondary | ICD-10-CM | POA: Diagnosis not present

## 2015-07-26 ENCOUNTER — Ambulatory Visit: Payer: Medicare Other | Admitting: Family Medicine

## 2015-07-26 DIAGNOSIS — L97511 Non-pressure chronic ulcer of other part of right foot limited to breakdown of skin: Secondary | ICD-10-CM | POA: Diagnosis not present

## 2015-07-26 DIAGNOSIS — I872 Venous insufficiency (chronic) (peripheral): Secondary | ICD-10-CM | POA: Diagnosis not present

## 2015-07-29 DIAGNOSIS — I872 Venous insufficiency (chronic) (peripheral): Secondary | ICD-10-CM | POA: Diagnosis not present

## 2015-07-29 DIAGNOSIS — L97511 Non-pressure chronic ulcer of other part of right foot limited to breakdown of skin: Secondary | ICD-10-CM | POA: Diagnosis not present

## 2015-07-30 ENCOUNTER — Ambulatory Visit (INDEPENDENT_AMBULATORY_CARE_PROVIDER_SITE_OTHER): Payer: Medicare Other | Admitting: Family Medicine

## 2015-07-30 ENCOUNTER — Encounter: Payer: Self-pay | Admitting: Family Medicine

## 2015-07-30 VITALS — BP 112/62 | HR 63 | Temp 98.4°F | Ht 63.0 in | Wt 99.4 lb

## 2015-07-30 DIAGNOSIS — I10 Essential (primary) hypertension: Secondary | ICD-10-CM | POA: Diagnosis not present

## 2015-07-30 DIAGNOSIS — E119 Type 2 diabetes mellitus without complications: Secondary | ICD-10-CM | POA: Diagnosis not present

## 2015-07-30 DIAGNOSIS — F419 Anxiety disorder, unspecified: Secondary | ICD-10-CM | POA: Diagnosis not present

## 2015-07-30 DIAGNOSIS — F0391 Unspecified dementia with behavioral disturbance: Secondary | ICD-10-CM

## 2015-07-30 DIAGNOSIS — I255 Ischemic cardiomyopathy: Secondary | ICD-10-CM

## 2015-07-30 DIAGNOSIS — F03B18 Unspecified dementia, moderate, with other behavioral disturbance: Secondary | ICD-10-CM

## 2015-07-30 MED ORDER — DONEPEZIL HCL 5 MG PO TABS: 5.0000 mg | ORAL_TABLET | Freq: Every day | ORAL | Status: DC

## 2015-07-30 MED ORDER — FLUOXETINE HCL 20 MG PO TABS: 20.0000 mg | ORAL_TABLET | Freq: Every day | ORAL | Status: AC

## 2015-07-30 MED ORDER — RAMIPRIL 2.5 MG PO CAPS: 2.5000 mg | ORAL_CAPSULE | Freq: Every day | ORAL | Status: DC

## 2015-07-30 NOTE — Patient Instructions (Addendum)
A few things to remember from today's visit:   Essential hypertension  Moderate dementia with behavioral disturbance - Plan: donepezil (ARICEPT) 5 MG tablet  Abnormal x-ray - Plan: ramipril (ALTACE) 2.5 MG capsule  Acute MI anterior wall first episode care (HCC) - Plan: ramipril (ALTACE) 2.5 MG capsule  Diabetes mellitus type II, non insulin dependent (HCC)  Vit E over the counter.  HgA1C goal < 8.0. Avoid sugar added food:regular soft drinks, energy drinks, and sports drinks. candy. cakes. cookies. pies and cobblers. sweet rolls, pastries, and donuts. fruit drinks, such as fruitades and fruit punch. dairy desserts, such as ice cream  Mediterranean diet has showed benefits for sugar control.  How much and what type of carbohydrate foods are important for managing diabetes. The balance between how much insulin is in your body and the carbohydrate you eat makes a difference in your blood glucose levels.      Avoid skipping meals, blood sugar might drop and cause serious problems. Remember checking feet periodically, good dental hygiene, and annual eye exam.     Please be sure medication list is accurate. If a new problem present, please set up appointment sooner than planned today.

## 2015-07-30 NOTE — Progress Notes (Signed)
HPI:   [name] is a 82 y.o. female, who is here today with her son to follow on last OV, 06/29/2015 when she was started on Fluoxetine for treatment of anxiety. Last OV, she established care with me.  She has Hx of "memory problems" for years now, episodes of confusion and this seems to cause anxiety, no agitation. Memory issues getting worse for the past year, sometimes she does not recognize family members.  She and her son moved to a new house about 2 months ago. She eats well, no changes in appetite.   Son is reporting no improvement in anxiety, he think she needs a "sedative" medication that she can take as needed.Last OV, he was asking for Xanax Rx,. He is upset because I did not agreed,he states that she is having episodes of acute anxiety 2-3 times per week.  She has hx of depression and anxiety. States that he is not sure why she has to come today.  She needs help with ADL's and IADL's, HH was arranged. She has a nurse aid 2 times per week and nurse once a week. Son is not satisfied, states that it is not enough and would like more time.  -I also wanted to discussed CXR findings and chest CT she had in 09/2014. CXR and rib X ray were ordered last OV because she had sudden onset of severe right rib cage. Son states that pain has resolved.  06/29/15 rib X ray: No acute rib fracture identified. Large masslike opacity at the left lung apex, corresponding to the 4.8 cm rounded masslike structure described in the left upper lobe on earlier chest CT of 09/29/2014. Chest CT: 4.8 cm rounded masslike opacity in the left upper lobe concerning for malignancy. May consider further evaluation with PET CT.Small right lower lobe subpleural nodules, nonspecific.   According to son, this "mass" she has had for "long time". It was noted during hospitalization after she had MI and has been there for   15 years, "double in size", "not huge" and "does not bothering her", she "chose not  to do any thing".  She wuit smoking since her last OV. COPD, she is on Symbicort 80-4.5 mcg bid and son is reporting great improvement in cough and wheezing.   Sleeping "Ok", wakes up in the middle of the night sometimes to use bathroom.  Hypertension: She is currently on Coreg 6.25 mg bid and Ramipril 2.5 mg bid. Her BP was on lower normal range last OV. Son is not checking BP at home. + CHF and CAD with aortic stenosis. Denies severe/frequent headache, visual changes, chest pain, dyspnea, palpitation, claudication, focal weakness, or edema.   DM II:  On non pharmacologic treatment. Son was informed after labs were done , today he states that he "did not have idea" she was diabetic.   Lab Results  Component Value Date   HGBA1C 7.4* 06/29/2015    No new concerns today.    Review of Systems  Constitutional: Negative for fever, activity change, appetite change, fatigue and unexpected weight change.  HENT: Negative for mouth sores, nosebleeds and trouble swallowing.   Eyes: Negative for redness and visual disturbance.  Respiratory: Negative for cough, shortness of breath and wheezing.   Cardiovascular: Negative for chest pain, palpitations and leg swelling.  Gastrointestinal: Negative for nausea, vomiting and abdominal pain.       Negative for changes in bowel habits.  Genitourinary: Negative for dysuria, hematuria, decreased urine volume  and difficulty urinating.  Skin: Negative for color change and rash.  Neurological: Negative for seizures, syncope, speech difficulty, weakness and headaches.  Psychiatric/Behavioral: Positive for confusion (stable). Negative for suicidal ideas, hallucinations, sleep disturbance and agitation. The patient is nervous/anxious.       Current Outpatient Prescriptions on File Prior to Visit  Medication Sig Dispense Refill  . acetaminophen (TYLENOL) 325 MG tablet Take 650 mg by mouth every 6 (six) hours as needed for mild pain.    Marland Kitchen aspirin EC  81 MG EC tablet Take 1 tablet (81 mg total) by mouth daily. 30 tablet 11  . atorvastatin (LIPITOR) 80 MG tablet Take 0.5 tablets (40 mg total) by mouth every Monday, Wednesday, and Friday. Take 1 tablet (80 mg total) Sunday, Tuesday, Thursday, and Saturday. 30 tablet 6  . budesonide-formoterol (SYMBICORT) 80-4.5 MCG/ACT inhaler Inhale 2 puffs into the lungs 2 (two) times daily. 1 Inhaler 3  . carvedilol (COREG) 6.25 MG tablet Take 1 tablet (6.25 mg total) by mouth 2 (two) times daily with a meal. 180 tablet 3  . nitroGLYCERIN (NITROSTAT) 0.4 MG SL tablet Place 1 tablet (0.4 mg total) under the tongue every 5 (five) minutes as needed for chest pain (up to 3 doses). 25 tablet 3  . ticagrelor (BRILINTA) 90 MG TABS tablet Take 1 tablet (90 mg total) by mouth 2 (two) times daily. 24 tablet 0   No current facility-administered medications on file prior to visit.     Past Medical History  Diagnosis Date  . ST elevation (STEMI) myocardial infarction involving left anterior descending coronary artery (HCC) 09/12/2014    CTO of RCA with new 100% p-mLAD  . CAD S/P percutaneous coronary angioplasty August 2016    a. Anterior STEMI 08/2014 - DES PCI to p-mLAD CAD - ; chronic total occlusion of mRCA with R-R/L-R collaterals, moderate Cx disease. EF 50% with inferior/apical mild HK.  . Mild aortic stenosis by prior echocardiogram     a. Mild AS (mean gradient 12 mmHg, AVA~1.07 cm^2)  . NSVT (nonsustained ventricular tachycardia) (HCC)     a. One run of NSVT on 09/12/14, possibly 2/2 reperfusion.  . Ischemic cardiomyopathy     a. Mild - cath 08/2014 showing EF 50% with inferior/apical mild HK, EF 45-50% by echo.  . Essential hypertension   . Dementia   . Hyperglycemia, unspecified     No diagnosis of diabetes  . Degenerative arthritis of lumbar spine   . Asthma   . Depression   . High blood pressure   . High cholesterol    No Known Allergies  Social History   Social History  . Marital Status:  Widowed    Spouse Name: N/A  . Number of Children: N/A  . Years of Education: [employer]   Social History Main Topics  . Smoking status: Current Every Day Smoker -- 0.50 packs/day for 60 years    Types: Cigarettes  . Smokeless tobacco: Never Used     Comment: a pack day all her life, will try to quit   . Alcohol Use: No  . Drug Use: No  . Sexual Activity: No   Other Topics Concern  . None   Social History Narrative   The patient currently lives with her son. Unfortunately they lost their home, so they're now living in the rental home of her step-grandson (he is the son of a deceased son's ex-wife)   The step grandsons name is Gershon Crane telephone number [phone].  Filed Vitals:   07/30/15 1143  BP: 112/62  Pulse: 63  Temp: 98.4 F (36.9 C)   Body mass index is 17.61 kg/(m^2).   SpO2 Readings from Last 3 Encounters:  07/30/15 97%  06/29/15 97%  10/07/14 99%      Physical Exam  Nursing note and vitals reviewed. Constitutional: She appears well-developed. No distress.  HENT:  Head: Atraumatic.  Mouth/Throat: Oropharynx is clear and moist and mucous membranes are normal.  Eyes: Conjunctivae and EOM are normal. Pupils are equal, round, and reactive to light.  Neck: No tracheal tenderness and no muscular tenderness present.  Cardiovascular: Normal rate and regular rhythm.   Murmur (SEM II-III/VI  RUSB.) heard.     Respiratory: Effort normal and breath sounds normal. No respiratory distress.  GI: Soft. She exhibits no mass. There is no tenderness.  Musculoskeletal: She exhibits no edema or tenderness.  Lymphadenopathy:    She has no cervical adenopathy.  Neurological: She is alert. She is disoriented (time and place).  She does not remember date. Remembers her name, not oriented in place (thinks she is in the hospital) and when asked who his son was, states "my brother"   Skin: Skin is warm. Ecchymosis noted. No erythema.  Ecchymosis noted on forearms    Psychiatric: She has a normal mood and affect.      ASSESSMENT AND PLAN:     Marisabel was seen today for follow-up.  Diagnoses and all orders for this visit:  Essential hypertension  Still on lower range, so Ramipril decreased from bid to qd. No changes in Coreg for now. Monitor BP at home. F/U in 3 months, before if needed.  -     ramipril (ALTACE) 2.5 MG capsule; Take 1 capsule (2.5 mg total) by mouth daily.  Moderate dementia with behavioral disturbance  ? Alzheimer's vs vascular. Options discussed: neuro evaluation vs starting treatment. Son agrees with starting Aricept 5 mg.Some side effects discussed, planning on increasing to 10 mg if well tolerated. OTC Vit E 400 IU daily. Puzzles, cross words may help. Son is not interested in institutional placement. HH can be continued but tried to explained limitations in time per week due to insurance.  -     donepezil (ARICEPT) 5 MG tablet; Take 1 tablet (5 mg total) by mouth at bedtime.  Diabetes mellitus type II, non insulin dependent (HCC)  For now non pharmacologic management. In general try to avoid sugar added foods. F/U in 3 months.  Anxiety disorder, unspecified  I do not agree with "sedative", if agitation Haloperidol can be considered, son states that she is not agitated. He agrees with continuing Fluoxetine for now and re-evaluate next OV. I do not recommend benzo or medications that may increase the risk of falls and worsen confusion.  -     FLUoxetine (PROZAC) 20 MG tablet; Take 1 tablet (20 mg total) by mouth daily.     Son is not satisfied with care I am providing. I offered a second opinion, she can be evaluated by another provider and/or neurologists. He prefers to hold on neurology evaluation.  I explained that we need to be cautions with medications at her age. During OV he made comments expressing disagreement with recommendations given today.  Also in regard to chest CT findings he refuse  further work up or referral, he understands this could be malignant.     -Ms.  Joanne Thompson's son was advised to return sooner than planned today if new concerns arise.  Betty G. Martin[place], MD  [facility]. [place] office.

## 2015-07-30 NOTE — Progress Notes (Signed)
Pre visit review using our clinic review tool, if applicable. No additional management support is needed unless otherwise documented below in the visit note. 

## 2015-08-04 DIAGNOSIS — I872 Venous insufficiency (chronic) (peripheral): Secondary | ICD-10-CM | POA: Diagnosis not present

## 2015-08-04 DIAGNOSIS — L97511 Non-pressure chronic ulcer of other part of right foot limited to breakdown of skin: Secondary | ICD-10-CM | POA: Diagnosis not present

## 2015-10-02 DIAGNOSIS — S60511A Abrasion of right hand, initial encounter: Secondary | ICD-10-CM | POA: Diagnosis not present

## 2015-10-28 ENCOUNTER — Encounter: Payer: Self-pay | Admitting: Family Medicine

## 2015-10-28 ENCOUNTER — Ambulatory Visit (INDEPENDENT_AMBULATORY_CARE_PROVIDER_SITE_OTHER): Payer: Medicare Other | Admitting: Family Medicine

## 2015-10-28 VITALS — BP 122/80 | HR 60 | Resp 16 | Ht 63.0 in | Wt 104.0 lb

## 2015-10-28 DIAGNOSIS — F0391 Unspecified dementia with behavioral disturbance: Secondary | ICD-10-CM

## 2015-10-28 DIAGNOSIS — E119 Type 2 diabetes mellitus without complications: Secondary | ICD-10-CM

## 2015-10-28 DIAGNOSIS — I2510 Atherosclerotic heart disease of native coronary artery without angina pectoris: Secondary | ICD-10-CM | POA: Diagnosis not present

## 2015-10-28 DIAGNOSIS — I255 Ischemic cardiomyopathy: Secondary | ICD-10-CM

## 2015-10-28 DIAGNOSIS — F419 Anxiety disorder, unspecified: Secondary | ICD-10-CM | POA: Diagnosis not present

## 2015-10-28 DIAGNOSIS — I10 Essential (primary) hypertension: Secondary | ICD-10-CM | POA: Diagnosis not present

## 2015-10-28 DIAGNOSIS — Z23 Encounter for immunization: Secondary | ICD-10-CM | POA: Diagnosis not present

## 2015-10-28 DIAGNOSIS — D519 Vitamin B12 deficiency anemia, unspecified: Secondary | ICD-10-CM

## 2015-10-28 DIAGNOSIS — F03B18 Unspecified dementia, moderate, with other behavioral disturbance: Secondary | ICD-10-CM

## 2015-10-28 DIAGNOSIS — J441 Chronic obstructive pulmonary disease with (acute) exacerbation: Secondary | ICD-10-CM

## 2015-10-28 DIAGNOSIS — I509 Heart failure, unspecified: Secondary | ICD-10-CM

## 2015-10-28 LAB — BASIC METABOLIC PANEL
BUN: 28 mg/dL — AB (ref 6–23)
CHLORIDE: 103 meq/L (ref 96–112)
CO2: 30 meq/L (ref 19–32)
Calcium: 9.5 mg/dL (ref 8.4–10.5)
Creatinine, Ser: 0.78 mg/dL (ref 0.40–1.20)
GFR: 74.97 mL/min (ref >60.00)
GLUCOSE: 145 mg/dL — AB (ref 70–99)
POTASSIUM: 4.4 meq/L (ref 3.5–5.1)
SODIUM: 138 meq/L (ref 135–145)

## 2015-10-28 LAB — LIPID PANEL
Cholesterol: 166 mg/dL (ref 0–200)
HDL: 44.8 mg/dL (ref >39.00)
LDL CALC: 104 mg/dL — AB (ref 0–99)
NonHDL: 121.23
Total CHOL/HDL Ratio: 4
Triglycerides: 87 mg/dL (ref 0.0–149.0)
VLDL: 17.4 mg/dL (ref 0.0–40.0)

## 2015-10-28 LAB — MICROALBUMIN / CREATININE URINE RATIO
CREATININE, U: 181.7 mg/dL
MICROALB/CREAT RATIO: 5 mg/g (ref 0.0–30.0)
Microalb, Ur: 9.1 mg/dL — ABNORMAL HIGH (ref 0.0–1.9)

## 2015-10-28 LAB — VITAMIN B12: Vitamin B-12: 693 pg/mL (ref 211–911)

## 2015-10-28 LAB — HEMOGLOBIN A1C: Hgb A1c MFr Bld: 7.7 % — ABNORMAL HIGH (ref 4.6–6.5)

## 2015-10-28 MED ORDER — BUDESONIDE-FORMOTEROL FUMARATE 160-4.5 MCG/ACT IN AERO: 2.0000 | INHALATION_SPRAY | Freq: Two times a day (BID) | RESPIRATORY_TRACT | 3 refills | Status: DC

## 2015-10-28 MED ORDER — DONEPEZIL HCL 10 MG PO TABS: 10.0000 mg | ORAL_TABLET | Freq: Every day | ORAL | 1 refills | Status: AC

## 2015-10-28 NOTE — Progress Notes (Signed)
Pre visit review using our clinic review tool, if applicable. No additional management support is needed unless otherwise documented below in the visit note. 

## 2015-10-28 NOTE — Patient Instructions (Signed)
A few things to remember from today's visit:   Diabetes mellitus type II, non insulin dependent (HCC) - Plan: Basic metabolic panel, HgB A1c, Urine Microalbumin w/creat. ratio  Moderate dementia with behavioral disturbance - Plan: donepezil (ARICEPT) 10 MG tablet  Anxiety disorder, unspecified type  Essential hypertension - Plan: Basic metabolic panel  Anemia due to vitamin B12 deficiency, unspecified B12 deficiency type - Plan: B12  ST elevation (STEMI) myocardial infarction involving left anterior descending coronary artery (HCC)  We may need to stop some of the medications that can increase the risk of bleeding. Aricept increased as well as Symbicort Rest unchanged.  Fall precautions.   Please be sure medication list is accurate. If a new problem present, please set up appointment sooner than planned today.

## 2015-10-28 NOTE — Progress Notes (Signed)
HPI:   [name] is a 82 y.o. female, who is here today with her grandson to follow on some of her chronic medical problems.  History provided  by grandson.  Since her last OV, 07/30/15,she has been in the ER because right hand laceration caused by her dog, according to grandson, bleeding could not be stopped. Lesion has healed completely. She hasn't had any fall, she uses her cane, she has a shower seat.   She has history of dementia, which seems to be a combination of vascular and Alzheimer's, last OV she started on Aricept 5 mg daily, which she has tolerated well. According to grandson, she had one day of diarrhea and stool incontinence but resolved. She is also on vitamin E daily. No major differences in memory, he seems to be overall stable. According to her grandson, placement in assisting living or NH has been considered.  She lives with son.  Anxiety: Currently she is on Prilosec) 20 mg daily, which has helped with agitation, she is eating better. She also reports sleeping better. She denies any suicidal thoughts and her grandson has not noted depression or social withdrawal.    Hypertension: Currently she is on Coreg 6.25 mg bid and Ramipril 2.5 mg, the latter one was decreased to 1/2 tab last OV because hypotension. Reporting no side effects from medication. No frequent/severe headache, visual changes, chest pain, dyspnea, palpitation, abdominal pain, nausea, vomiting, or edema.  Lab Results  Component Value Date   CREATININE 0.76 06/29/2015   BUN 28 (H) 06/29/2015   NA 138 06/29/2015   K 4.8 06/29/2015   CL 103 06/29/2015   CO2 27 06/29/2015   Hx of CAD, STEMI in 08/2014, last cardiologists visit in 05/2015. She is currently on Aspirin 81 mg and Brilinta 90 mg bid. No gross hematuria, nosebleeds, or gum bleeding reported.  CHF, last echo 08/2014 LVEF 45-50%. She denies any orthopnea or PND. She is on Lipitor 40 mg daily.    Diabetes Mellitus  II:  Dx 2017. Currently she is on non-pharmacologic treatment. Sugar intake was decreased.  She is not checking BS. Negative for abdominal pain, nausea, vomiting, polydipsia, polyuria, or polyphagia. No numbness, tingling, or burning.    Lab Results  Component Value Date   HGBA1C 7.4 (H) 06/29/2015    Lab Results  Component Value Date   CHOL 80 (L) 10/26/2014   HDL 33 (L) 10/26/2014   LDLCALC 39 10/26/2014   TRIG 38 10/26/2014   CHOLHDL 2.4 10/26/2014    COPD: No dyspnea or cough, occasionally she has occasionally wheezing. Former smoker. She is currently on Symbicort 80/4.5 g twice daily, which has helped with symptoms. She has not used Albuterol inh lately. No fever and no sputum production.  B12 deficiency, she is on OTC B12 supplementation.   No new concerns today.    Review of Systems  Constitutional: Negative for activity change, appetite change, fatigue, fever and unexpected weight change.  HENT: Negative for mouth sores, nosebleeds and trouble swallowing.   Eyes: Negative for pain, redness and visual disturbance.  Respiratory: Positive for wheezing. Negative for cough and shortness of breath.   Cardiovascular: Negative for chest pain, palpitations and leg swelling.  Gastrointestinal: Negative for abdominal pain, blood in stool, nausea and vomiting.       Negative for changes in bowel habits.  Endocrine: Negative for polydipsia, polyphagia and polyuria.  Genitourinary: Negative for decreased urine volume, difficulty urinating, dysuria and hematuria.  Neurological:  Negative for syncope, speech difficulty, weakness, numbness and headaches.  Hematological: Bruises/bleeds easily.  Psychiatric/Behavioral: Positive for confusion (stable). Negative for agitation and sleep disturbance. The patient is not nervous/anxious.       Current Outpatient Prescriptions on File Prior to Visit  Medication Sig Dispense Refill  . acetaminophen (TYLENOL) 325 MG tablet Take  650 mg by mouth every 6 (six) hours as needed for mild pain.    Marland Kitchen. aspirin EC 81 MG EC tablet Take 1 tablet (81 mg total) by mouth daily. 30 tablet 11  . atorvastatin (LIPITOR) 80 MG tablet Take 0.5 tablets (40 mg total) by mouth every Monday, Wednesday, and Friday. Take 1 tablet (80 mg total) Sunday, Tuesday, Thursday, and Saturday. 30 tablet 6  . carvedilol (COREG) 6.25 MG tablet Take 1 tablet (6.25 mg total) by mouth 2 (two) times daily with a meal. 180 tablet 3  . FLUoxetine (PROZAC) 20 MG tablet Take 1 tablet (20 mg total) by mouth daily. 30 tablet 3  . nitroGLYCERIN (NITROSTAT) 0.4 MG SL tablet Place 1 tablet (0.4 mg total) under the tongue every 5 (five) minutes as needed for chest pain (up to 3 doses). 25 tablet 3  . ramipril (ALTACE) 2.5 MG capsule Take 1 capsule (2.5 mg total) by mouth daily. 30 capsule 3  . ticagrelor (BRILINTA) 90 MG TABS tablet Take 1 tablet (90 mg total) by mouth 2 (two) times daily. 24 tablet 0   No current facility-administered medications on file prior to visit.      Past Medical History:  Diagnosis Date  . Asthma   . CAD S/P percutaneous coronary angioplasty August 2016   a. Anterior STEMI 08/2014 - DES PCI to p-mLAD CAD - ; chronic total occlusion of mRCA with R-R/L-R collaterals, moderate Cx disease. EF 50% with inferior/apical mild HK.  . Degenerative arthritis of lumbar spine   . Dementia   . Depression   . Essential hypertension   . High blood pressure   . High cholesterol   . Hyperglycemia, unspecified    No diagnosis of diabetes  . Ischemic cardiomyopathy    a. Mild - cath 08/2014 showing EF 50% with inferior/apical mild HK, EF 45-50% by echo.  . Mild aortic stenosis by prior echocardiogram    a. Mild AS (mean gradient 12 mmHg, AVA~1.07 cm^2)  . NSVT (nonsustained ventricular tachycardia) (HCC)    a. One run of NSVT on 09/12/14, possibly 2/2 reperfusion.  . ST elevation (STEMI) myocardial infarction involving left anterior descending coronary  artery (HCC) 09/12/2014   CTO of RCA with new 100% p-mLAD   No Known Allergies  Social History   Social History  . Marital status: Widowed    Spouse name: N/A  . Number of children: N/A  . Years of education: College   Social History Main Topics  . Smoking status: Former Smoker    Packs/day: 0.50    Years: 60.00    Types: Cigarettes    Quit date: 07/28/2015  . Smokeless tobacco: Never Used     Comment: a pack day all her life, will try to quit   . Alcohol use No  . Drug use: No  . Sexual activity: No   Other Topics Concern  . None   Social History Narrative   The patient currently lives with her son. Unfortunately they lost their home, so they're now living in the rental home of her step-grandson (he is the son of a deceased son's ex-wife)   The step grandsons  name is Joanne Thompson telephone number [phone].    Vitals:   10/28/15 1046  BP: 122/80  Pulse: 60  Resp: 16   O2 sat at RA 98%   Body mass index is 18.42 kg/m.   Wt Readings from Last 3 Encounters:  10/28/15 104 lb (47.2 kg)  07/30/15 99 lb 6 oz (45.1 kg)  06/29/15 99 lb 1.6 oz (45 kg)      Physical Exam  Nursing note and vitals reviewed. Constitutional: She appears well-developed. She does not appear ill. No distress.  HENT:  Head: Atraumatic.  Mouth/Throat: Oropharynx is clear and moist and mucous membranes are normal.  Poor dentition.  Eyes: Conjunctivae and EOM are normal. Pupils are equal, round, and reactive to light.  Neck: No JVD present.  Cardiovascular: Normal rate and regular rhythm.   Murmur: SEM II-III/VI  RUSB. Respiratory: Effort normal. No respiratory distress. She has wheezes (mild, bilateral.).  GI: Soft. She exhibits no mass. There is no tenderness.  Musculoskeletal: She exhibits no edema or tenderness.  Lymphadenopathy:    She has no cervical adenopathy.  Neurological: She is alert. She is disoriented (time and place).  Remembers her name. Does not remember her  grandson, she thinks he is a family friend.   Skin: Skin is warm. Ecchymosis (forearms bilateral) noted. No abrasion noted. No erythema.  Ecchymosis noted on forearms  Psychiatric: She has a normal mood and affect. Her speech is normal. She is not agitated and not actively hallucinating. Cognition and memory are impaired.  Well groomed, good eye contact.   Diabetic foot exam:  Monofilament normal bilateral. DP difficult to find. TP present bilateral. No ulcers.     ASSESSMENT AND PLAN:    Jaretzi was seen today for follow-up.  Diagnoses and all orders for this visit:    Diabetes mellitus type II, non insulin dependent (HCC)  Continue nonpharmacologic treatment. Foot care and periodic eye exam recommended. She also needs dental visits. Further recommendations would be given according to lab results. Follow-up in 4-6 months.   -     Basic metabolic panel -     HgB A1c -     Urine Microalbumin w/creat. ratio  Moderate dementia with behavioral disturbance  Stable. We discussed disease and progression as well as treatment effectiveness. We discussed some side effects of Aricept, since she is tolerating well dose increased from 5 mg to 10 mg daily. I am planning on adding Namenda if she tolerates well, I instructed her grandson to let me know in about 2 months how she's doing with the Aricept new dose. We also discussed some practices at home that could decrease episodes of confusion/agitation.  -     donepezil (ARICEPT) 10 MG tablet; Take 1 tablet (10 mg total) by mouth at bedtime.  Essential hypertension  Better today. No changes in current management. Low salt diet recommended.  F/U in 3-4 months, before if needed.  -     Basic metabolic panel  Anxiety disorder, unspecified type  Improved. Tolerating Fluoxetine well, no changes in current management. Follow-up in 3-4 months.  Coronary artery disease involving native heart, angina presence unspecified,  unspecified vessel or lesion type  No changes in Coreg or Lipitor. We discussed some side effects of aspirin and Brilinta as well as benefits. Continue following with cardiologist.  -     Lipid panel  Anemia due to vitamin B12 deficiency, unspecified B12 deficiency type  No changes in current management, will follow labs done today and  will give further recommendations accordingly.  -     B12  COPD with exacerbation (HCC)  Improved but still having some mild wheezing bilateral. Symbicort dose increased from 80/4.5 g 260/4.5 g twice daily. Instructed about warning signs. Influenza vaccine given today. Follow-up in 3-4 months.  -     budesonide-formoterol (SYMBICORT) 160-4.5 MCG/ACT inhaler; Inhale 2 puffs the lungs 2 (two) times daily.  Chronic congestive heart failure, unspecified congestive heart failure type (HCC)  Seems to be stable, asymptomatic. No changes on Coreg or ramipril today. Low salt diet to continue. Continue following with cardiologist.  Need for immunization against influenza -     Flu vaccine HIGH DOSE PF        -Ms. Joanne Thompson 's grandson was advised to return sooner than planned today if new concerns arise.       Joanne Somers G. [place], MD  [facility]. [place] office.

## 2015-10-29 ENCOUNTER — Ambulatory Visit: Payer: Medicare Other | Admitting: Family Medicine

## 2015-11-04 ENCOUNTER — Other Ambulatory Visit: Payer: Self-pay | Admitting: Cardiology

## 2015-11-04 DIAGNOSIS — I10 Essential (primary) hypertension: Secondary | ICD-10-CM

## 2015-11-04 NOTE — Telephone Encounter (Signed)
REFILL 

## 2015-12-02 ENCOUNTER — Telehealth: Payer: Self-pay | Admitting: Family Medicine

## 2015-12-02 DIAGNOSIS — J441 Chronic obstructive pulmonary disease with (acute) exacerbation: Secondary | ICD-10-CM

## 2015-12-02 NOTE — Telephone Encounter (Signed)
Pt need new Rx for SYMBICORT  #90  Pharm: [phone].

## 2015-12-02 NOTE — Telephone Encounter (Signed)
Is this ok to refill?  

## 2015-12-03 ENCOUNTER — Telehealth: Payer: Self-pay | Admitting: Cardiology

## 2015-12-03 MED ORDER — BUDESONIDE-FORMOTEROL FUMARATE 160-4.5 MCG/ACT IN AERO: 2.0000 | INHALATION_SPRAY | Freq: Two times a day (BID) | RESPIRATORY_TRACT | 2 refills | Status: AC

## 2015-12-03 NOTE — Telephone Encounter (Signed)
New Message  Pt c/o medication issue:  1. Name of Medication: brilinta  2. How are you currently taking this medication (dosage and times per day)?  90 mg tablets by mouth totaling 90 mg  3. Are you having a reaction (difficulty breathing--STAT)? No  4. What is your medication issue? Pts son is calling to see how long does pt need to continue to stay on this medication.  Please f/u with pt

## 2015-12-03 NOTE — Telephone Encounter (Signed)
Ok to refill medication, 3 months supply with 2 refills. Thanks, BJ

## 2015-12-03 NOTE — Telephone Encounter (Signed)
Rx faxed

## 2015-12-03 NOTE — Telephone Encounter (Signed)
Rx printed for signature and then will be faxed to [facility].

## 2015-12-03 NOTE — Telephone Encounter (Signed)
Spoke with pt son, it is time to renew the assistance through the company for brilinta and they want to make sure she still needs to take the brilinta. Will forward to dr harding for review and advise

## 2015-12-04 NOTE — Telephone Encounter (Signed)
I plan was to try to reduce her to the 60 mg tablets of Brilinta and potentially stop aspirin.  If there proves to be a problem getting continued coverage for Brilinta, I would then switch her to Plavix without aspirin. I was hoping to try to get at least 2 years of coverage before switching to Plavix.  Bryan Lemma

## 2015-12-07 ENCOUNTER — Telehealth: Payer: Self-pay | Admitting: Cardiology

## 2015-12-07 NOTE — Telephone Encounter (Signed)
New message  Pt's son is returning Joanne Thompson' call  Please call back

## 2015-12-07 NOTE — Telephone Encounter (Signed)
Left message for pt son to call  

## 2015-12-07 NOTE — Telephone Encounter (Signed)
Joanne Thompson (son)notified of Dr Herbie [place] message. He states that he has about a month left of the 80mg  and he is wondering if he calls assistance program, or do we have to call them?

## 2015-12-07 NOTE — Telephone Encounter (Signed)
Spoke with pt son, he is going to call [facility] and he will let us know what we need to do to get the reduced dose from the company.

## 2015-12-07 NOTE — Telephone Encounter (Signed)
Per last message: I plan was to try to reduce her to the 60 mg tablets of Brilinta and potentially stop aspirin.  If there proves to be a problem getting continued coverage for Brilinta, I would then switch her to Plavix without aspirin. I was hoping to try to get at least 2 years of coverage before switching to Plavix.  Joanne Thompson (son)notified. He states that he has about a month left of the 80mg  and he is wondering if he calls assistance program, or do we have to call them?

## 2015-12-20 ENCOUNTER — Telehealth: Payer: Self-pay | Admitting: Family Medicine

## 2015-12-20 NOTE — Telephone Encounter (Signed)
Son states Dr Elvis Coil liscence number is not on the refill reqwusest to [facility] for her budesonide-formoterol (SYMBICORT) 160-4.5 MCG/ACT inhaler Son would like [place] to contact them asap with the number so they can get pt's rx in a timely matter.

## 2015-12-21 NOTE — Telephone Encounter (Signed)
Rx re-faxed with license number. Son aware.

## 2015-12-27 ENCOUNTER — Other Ambulatory Visit: Payer: Self-pay | Admitting: Physician Assistant

## 2015-12-27 ENCOUNTER — Telehealth: Payer: Self-pay | Admitting: Family Medicine

## 2015-12-27 NOTE — Telephone Encounter (Signed)
Son states the new rx they received was budesonide-formoterol (SYMBICORT) 160-4.5 MCG/ACT inhaler  The previous rx was  budesonide-formoterol (SYMBICORT) 80-4.5 MCG/ACT inhaler  Is this correct?son would like to know before he starts this new dosage.

## 2015-12-27 NOTE — Telephone Encounter (Signed)
Called and spoke with son. Symbicort was increased per last office visit note. Patient's son verbalized understanding.

## 2016-01-16 ENCOUNTER — Emergency Department (HOSPITAL_COMMUNITY): Payer: Medicare Other

## 2016-01-16 ENCOUNTER — Inpatient Hospital Stay (HOSPITAL_COMMUNITY)
Admission: EM | Admit: 2016-01-16 | Discharge: 2016-01-19 | DRG: 536 | Disposition: A | Discharge status: Skilled Nursing Facility | Payer: Medicare Other | Attending: Family Medicine | Admitting: Family Medicine

## 2016-01-16 ENCOUNTER — Encounter (HOSPITAL_COMMUNITY): Payer: Self-pay | Admitting: *Deleted

## 2016-01-16 DIAGNOSIS — Z781 Physical restraint status: Secondary | ICD-10-CM | POA: Diagnosis not present

## 2016-01-16 DIAGNOSIS — S32401A Unspecified fracture of right acetabulum, initial encounter for closed fracture: Secondary | ICD-10-CM | POA: Diagnosis present

## 2016-01-16 DIAGNOSIS — J45909 Unspecified asthma, uncomplicated: Secondary | ICD-10-CM | POA: Diagnosis present

## 2016-01-16 DIAGNOSIS — E785 Hyperlipidemia, unspecified: Secondary | ICD-10-CM | POA: Diagnosis present

## 2016-01-16 DIAGNOSIS — Z823 Family history of stroke: Secondary | ICD-10-CM | POA: Diagnosis not present

## 2016-01-16 DIAGNOSIS — R918 Other nonspecific abnormal finding of lung field: Secondary | ICD-10-CM | POA: Diagnosis not present

## 2016-01-16 DIAGNOSIS — S32411A Displaced fracture of anterior wall of right acetabulum, initial encounter for closed fracture: Secondary | ICD-10-CM | POA: Diagnosis not present

## 2016-01-16 DIAGNOSIS — N3000 Acute cystitis without hematuria: Secondary | ICD-10-CM | POA: Diagnosis not present

## 2016-01-16 DIAGNOSIS — S32409A Unspecified fracture of unspecified acetabulum, initial encounter for closed fracture: Secondary | ICD-10-CM

## 2016-01-16 DIAGNOSIS — F0391 Unspecified dementia with behavioral disturbance: Secondary | ICD-10-CM | POA: Diagnosis present

## 2016-01-16 DIAGNOSIS — I10 Essential (primary) hypertension: Secondary | ICD-10-CM | POA: Diagnosis present

## 2016-01-16 DIAGNOSIS — S32414D Nondisplaced fracture of anterior wall of right acetabulum, subsequent encounter for fracture with routine healing: Secondary | ICD-10-CM | POA: Diagnosis not present

## 2016-01-16 DIAGNOSIS — I255 Ischemic cardiomyopathy: Secondary | ICD-10-CM | POA: Diagnosis present

## 2016-01-16 DIAGNOSIS — R52 Pain, unspecified: Secondary | ICD-10-CM

## 2016-01-16 DIAGNOSIS — M6281 Muscle weakness (generalized): Secondary | ICD-10-CM | POA: Diagnosis not present

## 2016-01-16 DIAGNOSIS — Z955 Presence of coronary angioplasty implant and graft: Secondary | ICD-10-CM

## 2016-01-16 DIAGNOSIS — S79911A Unspecified injury of right hip, initial encounter: Secondary | ICD-10-CM | POA: Diagnosis not present

## 2016-01-16 DIAGNOSIS — W010XXA Fall on same level from slipping, tripping and stumbling without subsequent striking against object, initial encounter: Secondary | ICD-10-CM | POA: Diagnosis present

## 2016-01-16 DIAGNOSIS — R2681 Unsteadiness on feet: Secondary | ICD-10-CM | POA: Diagnosis not present

## 2016-01-16 DIAGNOSIS — W19XXXA Unspecified fall, initial encounter: Secondary | ICD-10-CM

## 2016-01-16 DIAGNOSIS — Z87891 Personal history of nicotine dependence: Secondary | ICD-10-CM

## 2016-01-16 DIAGNOSIS — S32474A Nondisplaced fracture of medial wall of right acetabulum, initial encounter for closed fracture: Secondary | ICD-10-CM | POA: Diagnosis present

## 2016-01-16 DIAGNOSIS — Z7982 Long term (current) use of aspirin: Secondary | ICD-10-CM

## 2016-01-16 DIAGNOSIS — F329 Major depressive disorder, single episode, unspecified: Secondary | ICD-10-CM | POA: Diagnosis present

## 2016-01-16 DIAGNOSIS — I252 Old myocardial infarction: Secondary | ICD-10-CM

## 2016-01-16 DIAGNOSIS — I2510 Atherosclerotic heart disease of native coronary artery without angina pectoris: Secondary | ICD-10-CM | POA: Diagnosis present

## 2016-01-16 DIAGNOSIS — N390 Urinary tract infection, site not specified: Secondary | ICD-10-CM | POA: Diagnosis present

## 2016-01-16 DIAGNOSIS — S329XXA Fracture of unspecified parts of lumbosacral spine and pelvis, initial encounter for closed fracture: Secondary | ICD-10-CM | POA: Diagnosis not present

## 2016-01-16 DIAGNOSIS — Z79899 Other long term (current) drug therapy: Secondary | ICD-10-CM

## 2016-01-16 DIAGNOSIS — M25551 Pain in right hip: Secondary | ICD-10-CM | POA: Diagnosis not present

## 2016-01-16 DIAGNOSIS — S32414A Nondisplaced fracture of anterior wall of right acetabulum, initial encounter for closed fracture: Secondary | ICD-10-CM | POA: Diagnosis not present

## 2016-01-16 DIAGNOSIS — R739 Hyperglycemia, unspecified: Secondary | ICD-10-CM | POA: Diagnosis not present

## 2016-01-16 DIAGNOSIS — Z9181 History of falling: Secondary | ICD-10-CM | POA: Diagnosis not present

## 2016-01-16 DIAGNOSIS — S32402A Unspecified fracture of left acetabulum, initial encounter for closed fracture: Secondary | ICD-10-CM | POA: Diagnosis not present

## 2016-01-16 DIAGNOSIS — R488 Other symbolic dysfunctions: Secondary | ICD-10-CM | POA: Diagnosis not present

## 2016-01-16 DIAGNOSIS — R531 Weakness: Secondary | ICD-10-CM | POA: Diagnosis not present

## 2016-01-16 DIAGNOSIS — R1312 Dysphagia, oropharyngeal phase: Secondary | ICD-10-CM | POA: Diagnosis not present

## 2016-01-16 LAB — CBG MONITORING, ED: GLUCOSE-CAPILLARY: 272 mg/dL — AB (ref 65–99)

## 2016-01-16 LAB — CBC WITH DIFFERENTIAL/PLATELET
Basophils Absolute: 0 10*3/uL (ref 0.0–0.1)
Basophils Relative: 0 %
Eosinophils Absolute: 0 10*3/uL (ref 0.0–0.7)
Eosinophils Relative: 0 %
HCT: 33.9 % — ABNORMAL LOW (ref 36.0–46.0)
Hemoglobin: 11.3 g/dL — ABNORMAL LOW (ref 12.0–15.0)
Lymphocytes Relative: 5 %
Lymphs Abs: 0.5 10*3/uL — ABNORMAL LOW (ref 0.7–4.0)
MCH: 27.1 pg (ref 26.0–34.0)
MCHC: 33.3 g/dL (ref 30.0–36.0)
MCV: 81.3 fL (ref 78.0–100.0)
Monocytes Absolute: 0.6 10*3/uL (ref 0.1–1.0)
Monocytes Relative: 6 %
Neutro Abs: 9.4 10*3/uL — ABNORMAL HIGH (ref 1.7–7.7)
Neutrophils Relative %: 89 %
Platelets: 242 10*3/uL (ref 150–400)
RBC: 4.17 MIL/uL (ref 3.87–5.11)
RDW: 15.5 % (ref 11.5–15.5)
WBC: 10.5 10*3/uL (ref 4.0–10.5)

## 2016-01-16 LAB — BASIC METABOLIC PANEL
BUN: 37 mg/dL — AB (ref 4–21)
Creatinine: 0.8 mg/dL (ref 0.5–1.1)
Glucose: 143 mg/dL
Potassium: 3.8 mmol/L (ref 3.4–5.3)
SODIUM: 132 mmol/L — AB (ref 137–147)

## 2016-01-16 LAB — COMPREHENSIVE METABOLIC PANEL
ALBUMIN: 3.2 g/dL — AB (ref 3.5–5.0)
ALT: 22 U/L (ref 14–54)
AST: 27 U/L (ref 15–41)
Alkaline Phosphatase: 72 U/L (ref 38–126)
Anion gap: 7 (ref 5–15)
BILIRUBIN TOTAL: 1.5 mg/dL — AB (ref 0.3–1.2)
BUN: 37 mg/dL — AB (ref 6–20)
CALCIUM: 8.4 mg/dL — AB (ref 8.9–10.3)
CO2: 24 mmol/L (ref 22–32)
CREATININE: 0.8 mg/dL (ref 0.44–1.00)
Chloride: 101 mmol/L (ref 101–111)
GFR calc Af Amer: >60 mL/min (ref >60)
GLUCOSE: 143 mg/dL — AB (ref 65–99)
Potassium: 3.8 mmol/L (ref 3.5–5.1)
Sodium: 132 mmol/L — ABNORMAL LOW (ref 135–145)
TOTAL PROTEIN: 7.5 g/dL (ref 6.5–8.1)

## 2016-01-16 LAB — URINALYSIS, ROUTINE W REFLEX MICROSCOPIC
Bilirubin Urine: NEGATIVE
Glucose, UA: 50 mg/dL — AB
Ketones, ur: NEGATIVE mg/dL
Nitrite: NEGATIVE
Protein, ur: 30 mg/dL — AB
Specific Gravity, Urine: 1.019 (ref 1.005–1.030)
pH: 5 (ref 5.0–8.0)

## 2016-01-16 LAB — CBC AND DIFFERENTIAL
HEMATOCRIT: 34 % — AB (ref 36–46)
HEMOGLOBIN: 11.3 g/dL — AB (ref 12.0–16.0)
Platelets: 242 10*3/uL (ref 150–399)
WBC: 10.5 10^3/mL

## 2016-01-16 LAB — HEPATIC FUNCTION PANEL: BILIRUBIN, TOTAL: 1.5 mg/dL

## 2016-01-16 MED ORDER — LORAZEPAM 1 MG PO TABS: 1.0000 mg | ORAL_TABLET | Freq: Once | ORAL | Status: DC

## 2016-01-16 MED ORDER — LORAZEPAM 2 MG/ML IJ SOLN
1.0000 mg | Freq: Once | INTRAMUSCULAR | Status: AC
  Administered 2016-01-16: 1 mg via INTRAVENOUS
  Filled 2016-01-16: qty 1

## 2016-01-16 MED ORDER — DEXTROSE 5 % IV SOLN
1.0000 g | Freq: Once | INTRAVENOUS | Status: AC
  Administered 2016-01-16: 1 g via INTRAVENOUS
  Filled 2016-01-16: qty 10

## 2016-01-16 MED ORDER — SODIUM CHLORIDE 0.9 % IV BOLUS (SEPSIS)
500.0000 mL | Freq: Once | INTRAVENOUS | Status: AC
  Administered 2016-01-16: 500 mL via INTRAVENOUS

## 2016-01-16 NOTE — ED Notes (Signed)
PCP: MD [place] at [facility], next apt Feb 12th

## 2016-01-16 NOTE — ED Notes (Signed)
Pt very agitated. Keeps trying to get out of bed. Pt very hard to redirect. PT keeps taking BP cuff off when trying to obtain VS

## 2016-01-16 NOTE — ED Triage Notes (Signed)
Pt from home and fell about 1400 today c/o left hip pain when palpated.  Pt had advanced dementia.  Pt alert. 12 showed RBBB per EMS.  BP was 85/60 per EMS in route.  Per son (who witnessed fall) did not hit head and the family denies any blood thinners.

## 2016-01-16 NOTE — H&P (Signed)
History and Physical  [employer] Corporationlexis Zendayah Hardgrave, D.O.   Patient Name: Joanne Thompson MR#: [id number] Date of Birth: Nov 15, 1930 Date of Admission: 01/16/2016  Referring MD/NP/PA: Dr. Silverio LayYao / Ebbie Ridgehris Lawyer Primary Care Physician: Betty [place]andJordan, MD Outpatient Specialists: Dr. Herbie BaltimoreHarding (cardio) Patient coming from: Home with caregivers  Please note patient is a poor historian secondary to dementia + sedation. History obtained primarily from chart.   Chief Complaint: Mechanical fall  HPI: Joanne Seaatricia Noseworthy is a 82 y.o. female with a known history of asthma, HTN, HLD, dementia, depression, ischemic cardiomyopathy, NSVT, CAD s/p STEMI (8/16) presents to the emergency department for evaluation of hip pain s/p mechanical fall.  Per records, patient was in a usual state of health until this afternoon when she sustained a witnessed mechanical fall. Patient lives at home with a caregiver who indicated that she tripped and fell, landed on her right side, with no head trauma or LOC.     Otherwise there has been no change in status. Patient has been taking medication as prescribed and there has been no recent change in medication or diet.  There has been no recent illness, travel or sick contacts.    ED Course: Patient was found to have UTI and received one dose of Rocephin.  She received Ativan 1mg  for agitation and NS bolus.  Ortho was consulted by EDP.   Review of Systems:  Unable to obtain secondary to patient's dementia, sedation.    Past Medical History:  Diagnosis Date  . Asthma   . CAD S/P percutaneous coronary angioplasty August 2016   a. Anterior STEMI 08/2014 - DES PCI to p-mLAD CAD - ; chronic total occlusion of mRCA with R-R/L-R collaterals, moderate Cx disease. EF 50% with inferior/apical mild HK.  . Degenerative arthritis of lumbar spine   . Dementia   . Depression   . Essential hypertension   . High blood pressure   . High cholesterol   . Hyperglycemia, unspecified    No diagnosis of  diabetes  . Ischemic cardiomyopathy    a. Mild - cath 08/2014 showing EF 50% with inferior/apical mild HK, EF 45-50% by echo.  . Mild aortic stenosis by prior echocardiogram    a. Mild AS (mean gradient 12 mmHg, AVA~1.07 cm^2)  . NSVT (nonsustained ventricular tachycardia) (HCC)    a. One run of NSVT on 09/12/14, possibly 2/2 reperfusion.  . ST elevation (STEMI) myocardial infarction involving left anterior descending coronary artery (HCC) 09/12/2014   CTO of RCA with new 100% p-mLAD    Past Surgical History:  Procedure Laterality Date  . APPENDECTOMY    . CARDIAC CATHETERIZATION N/A 09/12/2014   Procedure: Left Heart Cath and Coronary Angiography;  Surgeon: Lyn RecordsHenry W Smith, MD;  [facility]; culprit = 100% pLAD --> PCI, mRCA CTO 100% (R-R & L-R collaterals), Mild-Mod (50%) dCx, Mod  65% RI. Inferior & apical HK - EF ~50%.  . CARDIAC CATHETERIZATION N/A 09/12/2014   Procedure: Coronary Stent Intervention;  Surgeon: Lyn RecordsHenry W Smith, MD;  [facility];  Service: Cardiovascular;  Promus Premier DES 3.0 mm x 20 mm p-mLAD  . CARDIAC SURGERY     pericardial window  . TRANSTHORACIC ECHOCARDIOGRAM  09/14/2014    Mild LVH. EF 45-50% (mildly reduced). Hypokinesis of mid inferior wall. GR 1 DD with high filling pressures. Mild AMS (mean gradient 12 mmHg, AVA~1.07 cm^2). Mild MR. Mild RV dilation. Moderate RA dilation     reports that she quit smoking about 5  months ago. Her smoking use included Cigarettes. She has a 30.00 pack-year smoking history. She has never used smokeless tobacco. She reports that she does not drink alcohol or use drugs.  No Known Allergies  Family History  Problem Relation Age of Onset  . Liver disease Mother   . Stroke Father    Family history has been reviewed and confirmed with patient.   Prior to Admission medications   Medication Sig Start Date End Date Taking? Authorizing Provider  acetaminophen (TYLENOL) 325 MG tablet Take 650 mg by  mouth every 6 (six) hours as needed for mild pain.   Yes Historical Provider, MD  aspirin EC 81 MG EC tablet Take 1 tablet (81 mg total) by mouth daily. Patient taking differently: Take 81 mg by mouth 2 (two) times daily. Takes w/ Brilinta 09/15/14  Yes Dayna N Dunn, PA-C  atorvastatin (LIPITOR) 80 MG tablet TAKE 1 TABLET (80 MG TOTAL) BY MOUTH EVERY EVENING. Patient taking differently: TAKE 40mg  BY MOUTH EVERY OTHER EVENING. 12/27/15  Yes Marykay Lex, MD  budesonide-formoterol [facility]) 160-4.5 MCG/ACT inhaler Inhale 2 puffs into the lungs 2 (two) times daily. 12/03/15  Yes Betty G [place], MD  carvedilol (COREG) 6.25 MG tablet Take 1 tablet (6.25 mg total) by mouth 2 (two) times daily with a meal. 01/22/15  Yes Janetta Hora, PA-C  cholecalciferol (VITAMIN D) 1000 units tablet Take 200 Units by mouth daily.   Yes Historical Provider, MD  Cyanocobalamin (B-12) 2500 MCG TABS Take 2,500 mcg by mouth daily.   Yes Historical Provider, MD  loperamide (IMODIUM) 2 MG capsule Take 2 mg by mouth as needed for diarrhea or loose stools.   Yes Historical Provider, MD  nitroGLYCERIN (NITROSTAT) 0.4 MG SL tablet Place 1 tablet (0.4 mg total) under the tongue every 5 (five) minutes as needed for chest pain (up to 3 doses). 09/14/14  Yes Dayna N Dunn, PA-C  Pumpkin Seed-Soy Germ (AZO BLADDER CONTROL/GO-LESS PO) Take 1 capsule by mouth 3 times/day as needed-between meals & bedtime.   Yes Historical Provider, MD  ramipril (ALTACE) 2.5 MG capsule Take 1 capsule (2.5 mg total) by mouth 2 (two) times daily. NEED OV. Patient taking differently: Take 2.5 mg by mouth daily. NEED OV. 11/04/15  Yes Marykay Lex, MD  ticagrelor (BRILINTA) 90 MG TABS tablet Take 1 tablet (90 mg total) by mouth 2 (two) times daily. 06/15/15  Yes Marykay Lex, MD  donepezil (ARICEPT) 10 MG tablet Take 1 tablet (10 mg total) by mouth at bedtime. Patient not taking: Reported on 01/16/2016 10/28/15   Betty G [place], MD  FLUoxetine  (PROZAC) 20 MG tablet Take 1 tablet (20 mg total) by mouth daily. Patient not taking: Reported on 01/16/2016 07/30/15   Betty G [place], MD    Physical Exam: Vitals:   01/16/16 1830 01/16/16 1834 01/16/16 2130 01/16/16 2200  BP: 112/61 112/61 (!) 117/103 138/85  Pulse:  66    Resp:  12    Temp:      TempSrc:      SpO2:  97%    Weight:      Height:        GENERAL: 82 y.o.-year-old female patient, well-developed, well-nourished lying in the bed in no acute distress. Awake, not responsive to questioning, not conversive. Follows commands.  HEENT: Head atraumatic, normocephalic. Pupils equal, round, reactive to light and accommodation. No scleral icterus. Extraocular muscles intact. Nares are patent. Oropharynx is clear. Mucus membranes moist. NECK: Supple, full  range of motion. No JVD, no bruit heard. No thyroid enlargement, no tenderness, no cervical lymphadenopathy. CHEST: Normal breath sounds bilaterally. No wheezing, rales, rhonchi or crackles. No use of accessory muscles of respiration.  No reproducible chest wall tenderness.  CARDIOVASCULAR: S1, S2 normal. No murmurs, rubs, or gallops. Cap refill <2 seconds. Pulses intact distally.  ABDOMEN: Soft, nondistended, nontender, . No rebound, guarding, rigidity. Normoactive bowel sounds present in all four quadrants. No organomegaly or mass. EXTREMITIES: No pedal edema, cyanosis, or clubbing.  Right hip tenderness to palpation, decreased range of motion secondary to pain.  NEUROLOGIC: Cranial nerves II through XII are grossly intact with no focal sensorimotor deficit. Muscle strength 5/5 in all extremities except RLE diminished muscle strength secondary to pain. Sensation intact. Gait not checked. SKIN: Warm, dry, and intact without obvious rash, lesion, or ulcer.   Labs on Admission: I have personally reviewed following labs and imaging studies  CBC:  Recent Labs Lab 01/16/16 1815  WBC 10.5  NEUTROABS 9.4*  HGB 11.3*  HCT 33.9*  MCV  81.3  PLT 242   Basic Metabolic Panel:  Recent Labs Lab 01/16/16 1815  NA 132*  K 3.8  CL 101  CO2 24  GLUCOSE 143*  BUN 37*  CREATININE 0.80  CALCIUM 8.4*   GFR: Estimated Creatinine Clearance: 37.8 mL/min (by C-G formula based on SCr of 0.8 mg/dL). Liver Function Tests:  Recent Labs Lab 01/16/16 1815  AST 27  ALT 22  ALKPHOS 72  BILITOT 1.5*  PROT 7.5  ALBUMIN 3.2*   No results for input(s): LIPASE, AMYLASE in the last 168 hours. No results for input(s): AMMONIA in the last 168 hours. Coagulation Profile: No results for input(s): INR, PROTIME in the last 168 hours. Cardiac Enzymes: No results for input(s): CKTOTAL, CKMB, CKMBINDEX, TROPONINI in the last 168 hours. BNP (last 3 results) No results for input(s): PROBNP in the last 8760 hours. HbA1C: No results for input(s): HGBA1C in the last 72 hours. CBG:  Recent Labs Lab 01/16/16 1558  GLUCAP 272*   Lipid Profile: No results for input(s): CHOL, HDL, LDLCALC, TRIG, CHOLHDL, LDLDIRECT in the last 72 hours. Thyroid Function Tests: No results for input(s): TSH, T4TOTAL, FREET4, T3FREE, THYROIDAB in the last 72 hours. Anemia Panel: No results for input(s): VITAMINB12, FOLATE, FERRITIN, TIBC, IRON, RETICCTPCT in the last 72 hours. Urine analysis:    Component Value Date/Time   COLORURINE AMBER (A) 01/16/2016 1753   APPEARANCEUR HAZY (A) 01/16/2016 1753   LABSPEC 1.019 01/16/2016 1753   PHURINE 5.0 01/16/2016 1753   GLUCOSEU 50 (A) 01/16/2016 1753   HGBUR SMALL (A) 01/16/2016 1753   BILIRUBINUR NEGATIVE 01/16/2016 1753   KETONESUR NEGATIVE 01/16/2016 1753   PROTEINUR 30 (A) 01/16/2016 1753   NITRITE NEGATIVE 01/16/2016 1753   LEUKOCYTESUR MODERATE (A) 01/16/2016 1753   Sepsis Labs: @LABRCNTIP (procalcitonin:4,lacticidven:4) )No results found for this or any previous visit (from the past 240 hour(s)).   Radiological Exams on Admission: Dg Chest 1 View  Result Date: 01/16/2016 CLINICAL DATA:   History of asthma and hypertension EXAM: CHEST 1 VIEW COMPARISON:  06/29/2015, CT chest 09/29/2014, radiograph chest 09/12/2014 FINDINGS: AP supine view chest. Oval mass in the apical portion of the left lung, corresponding to prior CT mass again demonstrated. The lungs are hyperinflated. No acute infiltrate or effusion. Right apical pleural thickening. Stable borderline to mild cardiomegaly. No pneumothorax. Stable sclerotic lesion mid right humerus. Old right-sided rib fractures. IMPRESSION: 1. Large oval mass/opacity in the apical portion of  the left upper lobe as previously described. 2. Mild hyperinflation.  No acute infiltrate. Electronically Signed   By: Jasmine Pang M.D.   On: 01/16/2016 19:19   Ct Pelvis Wo Contrast  Result Date: 01/16/2016 CLINICAL DATA:  Fall.  Concern for RIGHT hip fracture. EXAM: CT PELVIS WITHOUT CONTRAST TECHNIQUE: Multidetector CT imaging of the pelvis was performed following the standard protocol without intravenous contrast. COMPARISON:  None. FINDINGS: Urinary Tract:  Unremarkable Bowel:  Unremarkable Vascular/Lymphatic: This calcification aorta and branches. No lymphadenopathy Reproductive:  Uterus and ovaries grossly normal as noncontrast exam Other:  No free-fluid in the pelvis Musculoskeletal: There is a complex fracture of the RIGHT acetabulum involving the posterior wall, anterior wall and medial wall. There is minimal displacement of the fracture fragments. There is small hematoma within the RIGHT operator muscle which measures 16 mm in thickness. No fracture of the RIGHT femoral neck. Remote fracture of the LEFT superior pubic ramus. IMPRESSION: 1. Complex minimally displaced fracture of the RIGHT acetabulum involving the anterior wall, posterior wall and medial wall. 2. Small hematoma in the RIGHT operator muscle. Electronically Signed   By: Genevive Bi M.D.   On: 01/16/2016 19:14   Dg Hip Unilat With Pelvis 2-3 Views Right  Result Date:  01/16/2016 CLINICAL DATA:  Status post fall today. EXAM: DG HIP (WITH OR WITHOUT PELVIS) 2-3V RIGHT COMPARISON:  None. FINDINGS: There is no evidence of hip fracture or dislocation. There is chronic deformity left superior and inferior pubic rami. Degenerative joint changes of bilateral hips and visualized lumbar spine are noted. IMPRESSION: No acute fracture or dislocation. Electronically Signed   By: Sherian Rein M.D.   On: 01/16/2016 17:36    EKG: Pending  Assessment/Plan Active Problems:   Closed right acetabular fracture [facility])    This is a 82 y.o. female with a history of asthma, HTN, HLD, dementuia, depression, ischemic cardiomyopathy, NSVT, CAD s/p STEMI (8/16) now being admitted with:  1. Closed right acetabular fracture.  - Admit to inpatient - Pain control, NWB pending ortho - Ortho consult - Dr. Magnus Ivan contacted by EDP. Nonsurgical management.  - PT eval for dispo planning. - DVT Px with Lovenox and SCDs  2. UTI - IV Rocephin, follow up cultures  3. Hyperglycemia, no history of diabetes - Accuchecks with RISS coverage.  4. History of CAD - Continue aspirin, statin, Brilinta, nitro  5. History of hypertension - Contineu Coreg, Ramipril  6. History of dementia/depression - Continue Aricept, Prozac  7. History of asthma - Continue Symbicort - O2, Duonebs as needed  Admission status: Inpatient IV Fluids: IVNS Diet/Nutrition: Heart healthy carb controlled Consults called: Ortho, Dr. Magnus Ivan, PT DVT Px: Lovenox, SCDs and early ambulation Code Status: Full Code  Disposition Plan: TBD pending PT / Ortho   All the records are reviewed and case discussed with ED provider. Management plans discussed with the patient and/or family who express understanding and agree with plan of care.  Tonye Royalty MD [facility] Pager [phone]  If 7PM-7AM, please contact night-coverage [email or web address] Password [email or web address]  01/16/2016, 10:35 PM

## 2016-01-16 NOTE — ED Notes (Signed)
Pt placed on bedpan but was unable to urinate. Pt continues to get more agitated.

## 2016-01-16 NOTE — ED Notes (Signed)
Bed: RC16 Expected date:  Expected time:  Means of arrival:  Comments: Fall hip pain

## 2016-01-16 NOTE — ED Notes (Signed)
Pts caretaker Theodoro Grist (son) [phone]

## 2016-01-16 NOTE — ED Provider Notes (Signed)
[facility]-EMERGENCY DEPT Provider Note   CSN: [id number] Arrival date & time: 01/16/16  1535     History   Chief Complaint Chief Complaint  Patient presents with  . Fall    HPI Joanne Thompson is a 82 y.o. female.  HPI Patient presents to the emergency department with a fall that occurred around 2 PM this afternoon.  The patient lives at home with the caregiver.  The caregiver witnessed the fall, stating that she tripped and fell, landing on her right side.  She did not hit her head.  There is no loss of consciousness.  The patient has dementia and is unable to give me any history.  Family states that she has not had any recent illnesses of fever or vomiting. Past Medical History:  Diagnosis Date  . Asthma   . CAD S/P percutaneous coronary angioplasty August 2016   a. Anterior STEMI 08/2014 - DES PCI to p-mLAD CAD - ; chronic total occlusion of mRCA with R-R/L-R collaterals, moderate Cx disease. EF 50% with inferior/apical mild HK.  . Degenerative arthritis of lumbar spine   . Dementia   . Depression   . Essential hypertension   . High blood pressure   . High cholesterol   . Hyperglycemia, unspecified    No diagnosis of diabetes  . Ischemic cardiomyopathy    a. Mild - cath 08/2014 showing EF 50% with inferior/apical mild HK, EF 45-50% by echo.  . Mild aortic stenosis by prior echocardiogram    a. Mild AS (mean gradient 12 mmHg, AVA~1.07 cm^2)  . NSVT (nonsustained ventricular tachycardia) (HCC)    a. One run of NSVT on 09/12/14, possibly 2/2 reperfusion.  . ST elevation (STEMI) myocardial infarction involving left anterior descending coronary artery (HCC) 09/12/2014   CTO of RCA with new 100% p-mLAD    Patient Active Problem List   Diagnosis Date Noted  . Anemia, B12 deficiency 10/28/2015  . COPD with exacerbation (HCC) 10/28/2015  . Diabetes mellitus type II, non insulin dependent (HCC) 07/30/2015  . Anxiety disorder 07/30/2015  . CAD S/P percutaneous coronary angioplasty  09/14/2014  . Essential hypertension 09/14/2014  . Ischemic cardiomyopathy, mild 09/14/2014  . Hyperglycemia 09/14/2014  . NSVT (nonsustained ventricular tachycardia) (HCC) 09/14/2014  . Moderate dementia with behavioral disturbance   . Aortic stenosis   . ST elevation (STEMI) myocardial infarction involving left anterior descending coronary artery (HCC) 09/12/2014  . Mild aortic stenosis by prior echocardiogram 08/17/2014    Past Surgical History:  Procedure Laterality Date  . APPENDECTOMY    . CARDIAC CATHETERIZATION N/A 09/12/2014   Procedure: Left Heart Cath and Coronary Angiography;  Surgeon: Lyn Records, MD;  Location: [facility] INVASIVE CV LAB; culprit = 100% pLAD --> PCI, mRCA CTO 100% (R-R & L-R collaterals), Mild-Mod (50%) dCx, Mod  65% RI. Inferior & apical HK - EF ~50%.  . CARDIAC CATHETERIZATION N/A 09/12/2014   Procedure: Coronary Stent Intervention;  Surgeon: Lyn Records, MD;  Location: [facility] INVASIVE CV LAB;  Service: Cardiovascular;  Promus Premier DES 3.0 mm x 20 mm p-mLAD  . CARDIAC SURGERY     pericardial window  . TRANSTHORACIC ECHOCARDIOGRAM  09/14/2014    Mild LVH. EF 45-50% (mildly reduced). Hypokinesis of mid inferior wall. GR 1 DD with high filling pressures. Mild AMS (mean gradient 12 mmHg, AVA~1.07 cm^2). Mild MR. Mild RV dilation. Moderate RA dilation    OB History    No data available       Home Medications  Prior to Admission medications   Medication Sig Start Date End Date Taking? Authorizing Provider  acetaminophen (TYLENOL) 325 MG tablet Take 650 mg by mouth every 6 (six) hours as needed for mild pain.   Yes Historical Provider, MD  aspirin EC 81 MG EC tablet Take 1 tablet (81 mg total) by mouth daily. Patient taking differently: Take 81 mg by mouth 2 (two) times daily. Takes w/ Brilinta 09/15/14  Yes Dayna N Dunn, PA-C  atorvastatin (LIPITOR) 80 MG tablet TAKE 1 TABLET (80 MG TOTAL) BY MOUTH EVERY EVENING. Patient taking differently: TAKE 40mg  BY  MOUTH EVERY OTHER EVENING. 12/27/15  Yes Marykay Lexavid W Harding, MD  budesonide-formoterol [facility](SYMBICORT) 160-4.5 MCG/ACT inhaler Inhale 2 puffs into the lungs 2 (two) times daily. 12/03/15  Yes Betty G Swazil[place]Jordan, MD  carvedilol (COREG) 6.25 MG tablet Take 1 tablet (6.25 mg total) by mouth 2 (two) times daily with a meal. 01/22/15  Yes Janetta HoraKathryn R Thompson, PA-C  cholecalciferol (VITAMIN D) 1000 units tablet Take 200 Units by mouth daily.   Yes Historical Provider, MD  Cyanocobalamin (B-12) 2500 MCG TABS Take 2,500 mcg by mouth daily.   Yes Historical Provider, MD  loperamide (IMODIUM) 2 MG capsule Take 2 mg by mouth as needed for diarrhea or loose stools.   Yes Historical Provider, MD  nitroGLYCERIN (NITROSTAT) 0.4 MG SL tablet Place 1 tablet (0.4 mg total) under the tongue every 5 (five) minutes as needed for chest pain (up to 3 doses). 09/14/14  Yes Dayna N Dunn, PA-C  Pumpkin Seed-Soy Germ (AZO BLADDER CONTROL/GO-LESS PO) Take 1 capsule by mouth 3 times/day as needed-between meals & bedtime.   Yes Historical Provider, MD  ramipril (ALTACE) 2.5 MG capsule Take 1 capsule (2.5 mg total) by mouth 2 (two) times daily. NEED OV. Patient taking differently: Take 2.5 mg by mouth daily. NEED OV. 11/04/15  Yes Marykay Lexavid W Harding, MD  ticagrelor (BRILINTA) 90 MG TABS tablet Take 1 tablet (90 mg total) by mouth 2 (two) times daily. 06/15/15  Yes Marykay Lexavid W Harding, MD  donepezil (ARICEPT) 10 MG tablet Take 1 tablet (10 mg total) by mouth at bedtime. Patient not taking: Reported on 01/16/2016 10/28/15   Betty G [place]Jordan, MD  FLUoxetine (PROZAC) 20 MG tablet Take 1 tablet (20 mg total) by mouth daily. Patient not taking: Reported on 01/16/2016 07/30/15   Betty G [place]Jordan, MD    Family History Family History  Problem Relation Age of Onset  . Liver disease Mother   . Stroke Father     Social History Social History  Substance Use Topics  . Smoking status: Former Smoker    Packs/day: 0.50    Years: 60.00    Types: Cigarettes     Quit date: 07/28/2015  . Smokeless tobacco: Never Used     Comment: a pack day all her life, will try to quit   . Alcohol use No     Allergies   Patient has no known allergies.   Review of Systems Review of Systems Level V caveat applies due to dementia  Physical Exam Updated Vital Signs BP 112/61 (BP Location: Left Arm)   Pulse 66   Temp 98 F (36.7 C) (Oral)   Resp 12   Ht 5\' 6"  (1.676 m)   Wt 44.9 kg   SpO2 97%   BMI 15.98 kg/m   Physical Exam  Constitutional: She appears well-developed and well-nourished. No distress.  HENT:  Head: Normocephalic and atraumatic.  Mouth/Throat: Oropharynx is clear and  moist.  Eyes: Pupils are equal, round, and reactive to light.  Neck: Normal range of motion. Neck supple.  Cardiovascular: Normal rate, regular rhythm and normal heart sounds.  Exam reveals no gallop and no friction rub.   No murmur heard. Pulmonary/Chest: Effort normal and breath sounds normal. No respiratory distress. She has no wheezes.  Musculoskeletal:       Right hip: She exhibits decreased range of motion, tenderness and bony tenderness. She exhibits no swelling, no crepitus and no deformity.  Neurological: She is alert. She exhibits normal muscle tone. Coordination normal.  Skin: Skin is warm and dry. No rash noted. No erythema.  Psychiatric: She has a normal mood and affect. Her behavior is normal.  Nursing note and vitals reviewed.    ED Treatments / Results  Labs (all labs ordered are listed, but only abnormal results are displayed) Labs Reviewed  CBC WITH DIFFERENTIAL/PLATELET - Abnormal; Notable for the following:       Result Value   Hemoglobin 11.3 (*)    HCT 33.9 (*)    Neutro Abs 9.4 (*)    Lymphs Abs 0.5 (*)    All other components within normal limits  URINALYSIS, ROUTINE W REFLEX MICROSCOPIC - Abnormal; Notable for the following:    Color, Urine AMBER (*)    APPearance HAZY (*)    Glucose, UA 50 (*)    Hgb urine dipstick SMALL (*)     Protein, ur 30 (*)    Leukocytes, UA MODERATE (*)    Bacteria, UA MANY (*)    Squamous Epithelial / LPF 0-5 (*)    All other components within normal limits  COMPREHENSIVE METABOLIC PANEL - Abnormal; Notable for the following:    Sodium 132 (*)    Glucose, Bld 143 (*)    BUN 37 (*)    Calcium 8.4 (*)    Albumin 3.2 (*)    Total Bilirubin 1.5 (*)    All other components within normal limits  CBG MONITORING, ED - Abnormal; Notable for the following:    Glucose-Capillary 272 (*)    All other components within normal limits    EKG  EKG Interpretation None       Radiology Dg Chest 1 View  Result Date: 01/16/2016 CLINICAL DATA:  History of asthma and hypertension EXAM: CHEST 1 VIEW COMPARISON:  06/29/2015, CT chest 09/29/2014, radiograph chest 09/12/2014 FINDINGS: AP supine view chest. Oval mass in the apical portion of the left lung, corresponding to prior CT mass again demonstrated. The lungs are hyperinflated. No acute infiltrate or effusion. Right apical pleural thickening. Stable borderline to mild cardiomegaly. No pneumothorax. Stable sclerotic lesion mid right humerus. Old right-sided rib fractures. IMPRESSION: 1. Large oval mass/opacity in the apical portion of the left upper lobe as previously described. 2. Mild hyperinflation.  No acute infiltrate. Electronically Signed   By: Jasmine Pang M.D.   On: 01/16/2016 19:19   Ct Pelvis Wo Contrast  Result Date: 01/16/2016 CLINICAL DATA:  Fall.  Concern for RIGHT hip fracture. EXAM: CT PELVIS WITHOUT CONTRAST TECHNIQUE: Multidetector CT imaging of the pelvis was performed following the standard protocol without intravenous contrast. COMPARISON:  None. FINDINGS: Urinary Tract:  Unremarkable Bowel:  Unremarkable Vascular/Lymphatic: This calcification aorta and branches. No lymphadenopathy Reproductive:  Uterus and ovaries grossly normal as noncontrast exam Other:  No free-fluid in the pelvis Musculoskeletal: There is a complex fracture of  the RIGHT acetabulum involving the posterior wall, anterior wall and medial wall. There is minimal displacement  of the fracture fragments. There is small hematoma within the RIGHT operator muscle which measures 16 mm in thickness. No fracture of the RIGHT femoral neck. Remote fracture of the LEFT superior pubic ramus. IMPRESSION: 1. Complex minimally displaced fracture of the RIGHT acetabulum involving the anterior wall, posterior wall and medial wall. 2. Small hematoma in the RIGHT operator muscle. Electronically Signed   By: Genevive Bi M.D.   On: 01/16/2016 19:14   Dg Hip Unilat With Pelvis 2-3 Views Right  Result Date: 01/16/2016 CLINICAL DATA:  Status post fall today. EXAM: DG HIP (WITH OR WITHOUT PELVIS) 2-3V RIGHT COMPARISON:  None. FINDINGS: There is no evidence of hip fracture or dislocation. There is chronic deformity left superior and inferior pubic rami. Degenerative joint changes of bilateral hips and visualized lumbar spine are noted. IMPRESSION: No acute fracture or dislocation. Electronically Signed   By: Sherian Rein M.D.   On: 01/16/2016 17:36    Procedures Procedures (including critical care time)  Medications Ordered in ED Medications  LORazepam (ATIVAN) injection 1 mg (1 mg Intravenous Given 01/16/16 1820)  sodium chloride 0.9 % bolus 500 mL (0 mLs Intravenous Stopped 01/16/16 2003)  cefTRIAXone (ROCEPHIN) 1 g in dextrose 5 % 50 mL IVPB (1 g Intravenous New Bag/Given 01/16/16 2003)     Initial Impression / Assessment and Plan / ED Course  I have reviewed the triage vital signs and the nursing notes.  Pertinent labs & imaging results that were available during my care of the patient were reviewed by me and considered in my medical decision making (see chart for details).  Clinical Course   Patient will need admission for the acetabular fracture along with the UTI.  There was some concern by the family about the patient's living conditions with the caregiver, which  is a family member  Final Clinical Impressions(s) / ED Diagnoses   Final diagnoses:  Fall  Pain    New Prescriptions New Prescriptions   No medications on file     Charlestine Night, PA-C 01/16/16 2132    Charlestine Night, PA-C 01/16/16 2132    Charlynne Pander, MD 01/17/16 1425

## 2016-01-17 DIAGNOSIS — S32414A Nondisplaced fracture of anterior wall of right acetabulum, initial encounter for closed fracture: Principal | ICD-10-CM

## 2016-01-17 LAB — GLUCOSE, CAPILLARY
GLUCOSE-CAPILLARY: 156 mg/dL — AB (ref 65–99)
GLUCOSE-CAPILLARY: 107 mg/dL — AB (ref 65–99)
GLUCOSE-CAPILLARY: 260 mg/dL — AB (ref 65–99)
Glucose-Capillary: 155 mg/dL — ABNORMAL HIGH (ref 65–99)
Glucose-Capillary: 124 mg/dL — ABNORMAL HIGH (ref 65–99)

## 2016-01-17 LAB — CBC
HEMATOCRIT: 34.8 % — AB (ref 36.0–46.0)
HEMOGLOBIN: 11.6 g/dL — AB (ref 12.0–15.0)
MCH: 27.1 pg (ref 26.0–34.0)
MCHC: 33.3 g/dL (ref 30.0–36.0)
MCV: 81.3 fL (ref 78.0–100.0)
Platelets: 246 10*3/uL (ref 150–400)
RBC: 4.28 MIL/uL (ref 3.87–5.11)
RDW: 15.2 % (ref 11.5–15.5)
WBC: 11.6 10*3/uL — AB (ref 4.0–10.5)

## 2016-01-17 LAB — BASIC METABOLIC PANEL
ANION GAP: 8 (ref 5–15)
BUN: 26 mg/dL — AB (ref 4–21)
BUN: 26 mg/dL — ABNORMAL HIGH (ref 6–20)
CHLORIDE: 101 mmol/L (ref 101–111)
CO2: 24 mmol/L (ref 22–32)
CREATININE: 0.6 mg/dL (ref 0.5–1.1)
CREATININE: 0.61 mg/dL (ref 0.44–1.00)
Calcium: 8.3 mg/dL — ABNORMAL LOW (ref 8.9–10.3)
GFR calc non Af Amer: >60 mL/min (ref >60)
Glucose, Bld: 151 mg/dL — ABNORMAL HIGH (ref 65–99)
Glucose: 151 mg/dL
Potassium: 4.3 mmol/L (ref 3.4–5.3)
Potassium: 4.3 mmol/L (ref 3.5–5.1)
SODIUM: 133 mmol/L — AB (ref 135–145)
Sodium: 133 mmol/L — AB (ref 137–147)

## 2016-01-17 LAB — CBC AND DIFFERENTIAL
HCT: 34 % — AB (ref 36–46)
HEMOGLOBIN: 11.3 g/dL — AB (ref 12.0–16.0)
PLATELETS: 242 10*3/uL (ref 150–399)
WBC: 10.5 10^3/mL

## 2016-01-17 MED ORDER — BISACODYL 5 MG PO TBEC: 5.0000 mg | DELAYED_RELEASE_TABLET | Freq: Every day | ORAL | Status: DC | PRN

## 2016-01-17 MED ORDER — LOPERAMIDE HCL 2 MG PO CAPS: 2.0000 mg | ORAL_CAPSULE | ORAL | Status: DC | PRN

## 2016-01-17 MED ORDER — TICAGRELOR 90 MG PO TABS
90.0000 mg | ORAL_TABLET | Freq: Two times a day (BID) | ORAL | Status: DC
  Administered 2016-01-17 – 2016-01-19 (×5): 90 mg via ORAL
  Filled 2016-01-17 (×6): qty 1

## 2016-01-17 MED ORDER — DOCUSATE SODIUM 100 MG PO CAPS
100.0000 mg | ORAL_CAPSULE | Freq: Two times a day (BID) | ORAL | Status: DC
  Administered 2016-01-17 – 2016-01-19 (×5): 100 mg via ORAL
  Filled 2016-01-17 (×5): qty 1

## 2016-01-17 MED ORDER — INSULIN ASPART 100 UNIT/ML ~~LOC~~ SOLN: 0.0000 [IU] | Freq: Every day | SUBCUTANEOUS | Status: DC

## 2016-01-17 MED ORDER — CHOLECALCIFEROL 10 MCG (400 UNIT) PO TABS
200.0000 [IU] | ORAL_TABLET | Freq: Every day | ORAL | Status: DC
  Administered 2016-01-17 – 2016-01-19 (×3): 200 [IU] via ORAL
  Filled 2016-01-17 (×3): qty 1

## 2016-01-17 MED ORDER — MAGNESIUM CITRATE PO SOLN: 1.0000 | Freq: Once | ORAL | Status: DC | PRN

## 2016-01-17 MED ORDER — VITAMIN B-12 1000 MCG PO TABS
2500.0000 ug | ORAL_TABLET | Freq: Every day | ORAL | Status: DC
  Administered 2016-01-17 – 2016-01-19 (×3): 2500 ug via ORAL
  Filled 2016-01-17 (×3): qty 3

## 2016-01-17 MED ORDER — SENNOSIDES-DOCUSATE SODIUM 8.6-50 MG PO TABS: 1.0000 | ORAL_TABLET | Freq: Every evening | ORAL | Status: DC | PRN

## 2016-01-17 MED ORDER — NITROGLYCERIN 0.4 MG SL SUBL: 0.4000 mg | SUBLINGUAL_TABLET | SUBLINGUAL | Status: DC | PRN

## 2016-01-17 MED ORDER — ENOXAPARIN SODIUM 40 MG/0.4ML ~~LOC~~ SOLN
40.0000 mg | SUBCUTANEOUS | Status: DC
  Administered 2016-01-17 – 2016-01-19 (×3): 40 mg via SUBCUTANEOUS
  Filled 2016-01-17 (×3): qty 0.4

## 2016-01-17 MED ORDER — DEXTROSE 5 % IV SOLN
1.0000 g | INTRAVENOUS | Status: DC
  Administered 2016-01-17 – 2016-01-18 (×2): 1 g via INTRAVENOUS
  Filled 2016-01-17 (×3): qty 10

## 2016-01-17 MED ORDER — MORPHINE SULFATE (PF) 2 MG/ML IV SOLN
0.5000 mg | INTRAVENOUS | Status: DC | PRN
  Administered 2016-01-17 – 2016-01-18 (×2): 0.5 mg via INTRAVENOUS
  Filled 2016-01-17 (×2): qty 1

## 2016-01-17 MED ORDER — ATORVASTATIN CALCIUM 20 MG PO TABS
40.0000 mg | ORAL_TABLET | ORAL | Status: DC
  Administered 2016-01-17 – 2016-01-19 (×2): 40 mg via ORAL
  Filled 2016-01-17 (×3): qty 2

## 2016-01-17 MED ORDER — CARVEDILOL 6.25 MG PO TABS
6.2500 mg | ORAL_TABLET | Freq: Two times a day (BID) | ORAL | Status: DC
  Administered 2016-01-17 – 2016-01-19 (×4): 6.25 mg via ORAL
  Filled 2016-01-17 (×4): qty 1

## 2016-01-17 MED ORDER — RAMIPRIL 2.5 MG PO CAPS
2.5000 mg | ORAL_CAPSULE | Freq: Every day | ORAL | Status: DC
  Administered 2016-01-17 – 2016-01-19 (×3): 2.5 mg via ORAL
  Filled 2016-01-17 (×3): qty 1

## 2016-01-17 MED ORDER — MOMETASONE FURO-FORMOTEROL FUM 200-5 MCG/ACT IN AERO
2.0000 | INHALATION_SPRAY | Freq: Two times a day (BID) | RESPIRATORY_TRACT | Status: DC
  Administered 2016-01-18 – 2016-01-19 (×2): 2 via RESPIRATORY_TRACT
  Filled 2016-01-17: qty 8.8

## 2016-01-17 MED ORDER — INSULIN ASPART 100 UNIT/ML ~~LOC~~ SOLN
0.0000 [IU] | Freq: Three times a day (TID) | SUBCUTANEOUS | Status: DC
Start: 2016-01-17 — End: 2016-01-19
  Administered 2016-01-17: 13:00:00 5 [IU] via SUBCUTANEOUS
  Administered 2016-01-17: 17:00:00 2 [IU] via SUBCUTANEOUS
  Administered 2016-01-18: 13:00:00 3 [IU] via SUBCUTANEOUS
  Administered 2016-01-18: 1 [IU] via SUBCUTANEOUS
  Administered 2016-01-19: 10:00:00 2 [IU] via SUBCUTANEOUS

## 2016-01-17 MED ORDER — ASPIRIN EC 81 MG PO TBEC
81.0000 mg | DELAYED_RELEASE_TABLET | Freq: Two times a day (BID) | ORAL | Status: DC
  Administered 2016-01-17 – 2016-01-19 (×5): 81 mg via ORAL
  Filled 2016-01-17 (×5): qty 1

## 2016-01-17 MED ORDER — HYDROCODONE-ACETAMINOPHEN 5-325 MG PO TABS
1.0000 | ORAL_TABLET | Freq: Four times a day (QID) | ORAL | Status: DC | PRN
  Administered 2016-01-17 – 2016-01-18 (×3): 2 via ORAL
  Filled 2016-01-17 (×3): qty 2

## 2016-01-17 NOTE — Consult Note (Signed)
Reason for Consult: right pelvic acetabular fracture Referring Physician:  Darrick Meigs, MD  Joanne Thompson is an 83 y.o. female.  HPI:   83 yo female with dementia who was brought to the ED last evening with left hip pain after a mechanical fall.  A CT scan, which I have independently reviewed, shows a non-displaced left acetabular fracture.  She was admitted to the Medicine Service due to this injury.  She does have multiple medical problems and is in restraints in bed this am.  She is resting due to pain meds, but is arousible.  She is obviously demented and does not follow commands for me this am.  She does seems to express pain with passive movement of her right hip.  Past Medical History:  Diagnosis Date  . Asthma   . CAD S/P percutaneous coronary angioplasty August 2016   a. Anterior STEMI 08/2014 - DES PCI to p-mLAD CAD - ; chronic total occlusion of mRCA with R-R/L-R collaterals, moderate Cx disease. EF 50% with inferior/apical mild HK.  . Degenerative arthritis of lumbar spine   . Dementia   . Depression   . Essential hypertension   . High blood pressure   . High cholesterol   . Hyperglycemia, unspecified    No diagnosis of diabetes  . Ischemic cardiomyopathy    a. Mild - cath 08/2014 showing EF 50% with inferior/apical mild HK, EF 45-50% by echo.  . Mild aortic stenosis by prior echocardiogram    a. Mild AS (mean gradient 12 mmHg, AVA~1.07 cm^2)  . NSVT (nonsustained ventricular tachycardia) ([facility])    a. One run of NSVT on 09/12/14, possibly 2/2 reperfusion.  . ST elevation (STEMI) myocardial infarction involving left anterior descending coronary artery ([place]) 09/12/2014   CTO of RCA with new 100% p-mLAD    Past Surgical History:  Procedure Laterality Date  . APPENDECTOMY    . CARDIAC CATHETERIZATION N/A 09/12/2014   Procedure: Left Heart Cath and Coronary Angiography;  Surgeon: Belva Crome, MD;  Location: [place] CV LAB; culprit = 100% pLAD --> PCI, mRCA CTO 100% (R-R & L-R  collaterals), Mild-Mod (50%) dCx, Mod  65% RI. Inferior & apical HK - EF ~50%.  . CARDIAC CATHETERIZATION N/A 09/12/2014   Procedure: Coronary Stent Intervention;  Surgeon: Belva Crome, MD;  Location: [place] CV LAB;  Service: Cardiovascular;  Promus Premier DES 3.0 mm x 20 mm p-mLAD  . CARDIAC SURGERY     pericardial window  . TRANSTHORACIC ECHOCARDIOGRAM  09/14/2014    Mild LVH. EF 45-50% (mildly reduced). Hypokinesis of mid inferior wall. GR 1 DD with high filling pressures. Mild AMS (mean gradient 12 mmHg, AVA~1.07 cm^2). Mild MR. Mild RV dilation. Moderate RA dilation    Family History  Problem Relation Age of Onset  . Liver disease Mother   . Stroke Father     Social History:  reports that she quit smoking about 5 months ago. Her smoking use included Cigarettes. She has a 30.00 pack-year smoking history. She has never used smokeless tobacco. She reports that she does not drink alcohol or use drugs.  Allergies: No Known Allergies  Medications: I have reviewed the patient's current medications.  Results for orders placed or performed during the hospital encounter of 01/16/16 (from the past 48 hour(s))  CBG monitoring, ED     Status: Abnormal   Collection Time: 01/16/16  3:58 PM  Result Value Ref Range   Glucose-Capillary 272 (H) 65 - 99 mg/dL  Comment 1 Notify RN    Comment 2 Document in Chart   Urinalysis, Routine w reflex microscopic     Status: Abnormal   Collection Time: 01/16/16  5:53 PM  Result Value Ref Range   Color, Urine AMBER (A) YELLOW    Comment: BIOCHEMICALS MAY BE AFFECTED BY COLOR   APPearance HAZY (A) CLEAR   Specific Gravity, Urine 1.019 1.005 - 1.030   pH 5.0 5.0 - 8.0   Glucose, UA 50 (A) NEGATIVE mg/dL   Hgb urine dipstick SMALL (A) NEGATIVE   Bilirubin Urine NEGATIVE NEGATIVE   Ketones, ur NEGATIVE NEGATIVE mg/dL   Protein, ur 30 (A) NEGATIVE mg/dL   Nitrite NEGATIVE NEGATIVE   Leukocytes, UA MODERATE (A) NEGATIVE   RBC / HPF 0-5 0 - 5  RBC/hpf   WBC, UA TOO NUMEROUS TO COUNT 0 - 5 WBC/hpf   Bacteria, UA MANY (A) NONE SEEN   Squamous Epithelial / LPF 0-5 (A) NONE SEEN   Mucous PRESENT   CBC with Differential     Status: Abnormal   Collection Time: 01/16/16  6:15 PM  Result Value Ref Range   WBC 10.5 4.0 - 10.5 K/uL   RBC 4.17 3.87 - 5.11 MIL/uL   Hemoglobin 11.3 (L) 12.0 - 15.0 g/dL   HCT 33.9 (L) 36.0 - 46.0 %   MCV 81.3 78.0 - 100.0 fL   MCH 27.1 26.0 - 34.0 pg   MCHC 33.3 30.0 - 36.0 g/dL   RDW 15.5 11.5 - 15.5 %   Platelets 242 150 - 400 K/uL   Neutrophils Relative % 89 %   Neutro Abs 9.4 (H) 1.7 - 7.7 K/uL   Lymphocytes Relative 5 %   Lymphs Abs 0.5 (L) 0.7 - 4.0 K/uL   Monocytes Relative 6 %   Monocytes Absolute 0.6 0.1 - 1.0 K/uL   Eosinophils Relative 0 %   Eosinophils Absolute 0.0 0.0 - 0.7 K/uL   Basophils Relative 0 %   Basophils Absolute 0.0 0.0 - 0.1 K/uL  Comprehensive metabolic panel     Status: Abnormal   Collection Time: 01/16/16  6:15 PM  Result Value Ref Range   Sodium 132 (L) 135 - 145 mmol/L   Potassium 3.8 3.5 - 5.1 mmol/L   Chloride 101 101 - 111 mmol/L   CO2 24 22 - 32 mmol/L   Glucose, Bld 143 (H) 65 - 99 mg/dL   BUN 37 (H) 6 - 20 mg/dL   Creatinine, Ser 0.80 0.44 - 1.00 mg/dL   Calcium 8.4 (L) 8.9 - 10.3 mg/dL   Total Protein 7.5 6.5 - 8.1 g/dL   Albumin 3.2 (L) 3.5 - 5.0 g/dL   AST 27 15 - 41 U/L   ALT 22 14 - 54 U/L   Alkaline Phosphatase 72 38 - 126 U/L   Total Bilirubin 1.5 (H) 0.3 - 1.2 mg/dL   GFR calc non Af Amer >60 >60 mL/min   GFR calc Af Amer >60 >60 mL/min    Comment: (NOTE) The eGFR has been calculated using the CKD EPI equation. This calculation has not been validated in all clinical situations. eGFR's persistently <60 mL/min signify possible Chronic Kidney Disease.    Anion gap 7 5 - 15  Glucose, capillary     Status: Abnormal   Collection Time: 01/17/16  3:01 AM  Result Value Ref Range   Glucose-Capillary 156 (H) 65 - 99 mg/dL  CBC     Status:  Abnormal   Collection Time: 01/17/16  4:28 AM  Result Value Ref Range   WBC 11.6 (H) 4.0 - 10.5 K/uL   RBC 4.28 3.87 - 5.11 MIL/uL   Hemoglobin 11.6 (L) 12.0 - 15.0 g/dL   HCT 34.8 (L) 36.0 - 46.0 %   MCV 81.3 78.0 - 100.0 fL   MCH 27.1 26.0 - 34.0 pg   MCHC 33.3 30.0 - 36.0 g/dL   RDW 15.2 11.5 - 15.5 %   Platelets 246 150 - 400 K/uL  Basic metabolic panel     Status: Abnormal   Collection Time: 01/17/16  4:28 AM  Result Value Ref Range   Sodium 133 (L) 135 - 145 mmol/L   Potassium 4.3 3.5 - 5.1 mmol/L   Chloride 101 101 - 111 mmol/L   CO2 24 22 - 32 mmol/L   Glucose, Bld 151 (H) 65 - 99 mg/dL   BUN 26 (H) 6 - 20 mg/dL   Creatinine, Ser 0.61 0.44 - 1.00 mg/dL   Calcium 8.3 (L) 8.9 - 10.3 mg/dL   GFR calc non Af Amer >60 >60 mL/min   GFR calc Af Amer >60 >60 mL/min    Comment: (NOTE) The eGFR has been calculated using the CKD EPI equation. This calculation has not been validated in all clinical situations. eGFR's persistently <60 mL/min signify possible Chronic Kidney Disease.    Anion gap 8 5 - 15  Glucose, capillary     Status: Abnormal   Collection Time: 01/17/16  8:12 AM  Result Value Ref Range   Glucose-Capillary 107 (H) 65 - 99 mg/dL    Dg Chest 1 View  Result Date: 01/16/2016 CLINICAL DATA:  History of asthma and hypertension EXAM: CHEST 1 VIEW COMPARISON:  06/29/2015, CT chest 09/29/2014, radiograph chest 09/12/2014 FINDINGS: AP supine view chest. Oval mass in the apical portion of the left lung, corresponding to prior CT mass again demonstrated. The lungs are hyperinflated. No acute infiltrate or effusion. Right apical pleural thickening. Stable borderline to mild cardiomegaly. No pneumothorax. Stable sclerotic lesion mid right humerus. Old right-sided rib fractures. IMPRESSION: 1. Large oval mass/opacity in the apical portion of the left upper lobe as previously described. 2. Mild hyperinflation.  No acute infiltrate. Electronically Signed   By: Donavan Foil M.D.    On: 01/16/2016 19:19   Ct Pelvis Wo Contrast  Result Date: 01/16/2016 CLINICAL DATA:  Fall.  Concern for RIGHT hip fracture. EXAM: CT PELVIS WITHOUT CONTRAST TECHNIQUE: Multidetector CT imaging of the pelvis was performed following the standard protocol without intravenous contrast. COMPARISON:  None. FINDINGS: Urinary Tract:  Unremarkable Bowel:  Unremarkable Vascular/Lymphatic: This calcification aorta and branches. No lymphadenopathy Reproductive:  Uterus and ovaries grossly normal as noncontrast exam Other:  No free-fluid in the pelvis Musculoskeletal: There is a complex fracture of the RIGHT acetabulum involving the posterior wall, anterior wall and medial wall. There is minimal displacement of the fracture fragments. There is small hematoma within the RIGHT operator muscle which measures 16 mm in thickness. No fracture of the RIGHT femoral neck. Remote fracture of the LEFT superior pubic ramus. IMPRESSION: 1. Complex minimally displaced fracture of the RIGHT acetabulum involving the anterior wall, posterior wall and medial wall. 2. Small hematoma in the RIGHT operator muscle. Electronically Signed   By: Suzy Bouchard M.D.   On: 01/16/2016 19:14   Dg Hip Unilat With Pelvis 2-3 Views Right  Result Date: 01/16/2016 CLINICAL DATA:  Status post fall today. EXAM: DG HIP (WITH OR WITHOUT PELVIS) 2-3V RIGHT COMPARISON:  None. FINDINGS:  There is no evidence of hip fracture or dislocation. There is chronic deformity left superior and inferior pubic rami. Degenerative joint changes of bilateral hips and visualized lumbar spine are noted. IMPRESSION: No acute fracture or dislocation. Electronically Signed   By: Abelardo Diesel M.D.   On: 01/16/2016 17:36    ROS Blood pressure (!) 124/109, pulse 77, temperature 97.7 F (36.5 C), temperature source Axillary, resp. rate 18, height 5' 6"  (1.676 m), weight 99 lb (44.9 kg), SpO2 100 %. Physical Exam  Constitutional: She appears well-developed.  HENT:  Head:  Normocephalic.  Cardiovascular: Normal rate.   Respiratory: Effort normal.  GI: Soft.  Musculoskeletal:       Right hip: She exhibits decreased range of motion, tenderness and bony tenderness.    Assessment/Plan: Non-displaced right acetabular fracture 1)  She will need to be completely non-weight bearing on her right hip for 8-12 weeks given her soft-bone and the nature of these fractures.  She will need skilled nursing placement most likely.  I can see her in follow-up as an outpatient in 2 weeks.  Mcarthur Rossetti 01/17/2016, 8:21 AM

## 2016-01-17 NOTE — Discharge Instructions (Signed)
No weight on right hip at all for the next 8-12 weeks.

## 2016-01-17 NOTE — Evaluation (Signed)
Physical Therapy Evaluation Patient Details Name: Joanne Thompson MRN: [id number] DOB: Mar 16, 1930 Today's Date: 01/17/2016   History of Present Illness  83 yo female with dementia who was brought to the ED last evening with left hip pain after a mechanical fall.  CT scan shows a non-displaced left acetabular fracture  Clinical Impression  Pt admitted with above diagnosis. Pt currently with functional limitations due to the deficits listed below (see PT Problem List).  Pt will benefit from skilled PT to increase their independence and safety with mobility to allow discharge to the venue listed below.  Recommend SNF unless family can care for pt 24/7     Follow Up Recommendations SNF (unless family able to provide 24hr assist)    Equipment Recommendations  None recommended by PT    Recommendations for Other Services       Precautions / Restrictions Precautions Precautions: Fall Restrictions RLE Weight Bearing: Non weight bearing      Mobility  Bed Mobility Overal bed mobility: Needs Assistance Bed Mobility: Supine to Sit     Supine to sit: Max assist     General bed mobility comments: assist for trunk and LEs, pt does attempt to self assist but is limited by pain   Transfers Overall transfer level: Needs assistance   Transfers: Lateral/Scoot Transfers          Lateral/Scoot Transfers: +2 physical assistance;Total assist General transfer comment: bed pad used to scoot pt bed to chair  Ambulation/Gait                Stairs            Wheelchair Mobility    Modified Rankin (Stroke Patients Only)       Balance Overall balance assessment: Needs assistance;History of Falls   Sitting balance-Leahy Scale: Poor Sitting balance - Comments: to fair, pt is able to maintain static sit with supervision with incr time in sitting       Standing balance comment: NT d/t pt dementia/difficulty following multi step commands for NWB RLE                              Pertinent Vitals/Pain Faces Pain Scale: Hurts little more Pain Location: R hip/groin with imposed movement Pain Descriptors / Indicators: Grimacing Pain Intervention(s): Monitored during session    Home Living   Living Arrangements: Children               Additional Comments: per chart it appears son is pt's caregiver; pt is unable to give hx and no family present during PT eval    Prior Function           Comments: unable to determine from pt     Hand Dominance        Extremity/Trunk Assessment   Upper Extremity Assessment Upper Extremity Assessment: Defer to OT evaluation;Generalized weakness    Lower Extremity Assessment Lower Extremity Assessment: Generalized weakness       Communication      Cognition Arousal/Alertness: Awake/alert (sleeping but aroused easily) Behavior During Therapy: WFL for tasks assessed/performed Overall Cognitive Status: History of cognitive impairments - at baseline Area of Impairment: Orientation;Following commands Orientation Level: Disoriented to;Place;Time;Situation     Following Commands: Follows one step commands with increased time       General Comments: pt follows functional commands and is cooperative; responded appropriately for majority of session other than asking for family members that aren't  present    General Comments      Exercises     Assessment/Plan    PT Assessment Patient needs continued PT services  PT Problem List Decreased strength;Decreased activity tolerance;Decreased balance;Decreased cognition;Decreased mobility          PT Treatment Interventions Functional mobility training;Therapeutic activities;Therapeutic exercise;Balance training    PT Goals (Current goals can be found in the Care Plan section)  Acute Rehab PT Goals Patient Stated Goal: home PT Goal Formulation: With patient Time For Goal Achievement: 01/24/16 Potential to Achieve Goals: Fair     Frequency Min 3X/week   Barriers to discharge        Co-evaluation               End of Session   Activity Tolerance: Patient tolerated treatment well Patient left: in chair;with call bell/phone within reach;with chair alarm set Nurse Communication: Mobility status         Time: 1610-9604 PT Time Calculation (min) (ACUTE ONLY): 21 min   Charges:   PT Evaluation $PT Eval Moderate Complexity: 1 Procedure     PT G Codes:        Shada Nienaber 01-19-16, 11:34 AM

## 2016-01-17 NOTE — Progress Notes (Signed)
Unable to complete admission assessment because of patient's cognitive impairment. No family member accompanied patient

## 2016-01-17 NOTE — Progress Notes (Signed)
Patient arrived on the unit at approximately 0035 01/16/16. No family member present. Patient with advanced dementia and was observed removing her clothes and swinging her legs over the bed rails. Received order to place foley cath and it was inserted at 0115 without difficulty. Bilateral mittens placed at approximately 0230 because patient was pulling foley cath. Will continue to monitor for safety.

## 2016-01-17 NOTE — Clinical Social Work Note (Signed)
Clinical Social Work Assessment  Patient Details  Name: Joanne Thompson MRN: [id number] Date of Birth: [birth year]  Date of referral:  01/17/16               Reason for consult:  Discharge Planning                Permission sought to share information with:  Oceanographer granted to share information::  Yes, Verbal Permission Granted  Name::        Agency::     Relationship::     Contact Information:     Housing/Transportation Living arrangements for the past 2 months:  Single Family Home Source of Information:  Adult Children Patient Interpreter Needed:  None Criminal Activity/Legal Involvement Pertinent to Current Situation/Hospitalization:  No - Comment as needed Significant Relationships:  Adult Children Lives with:  Adult Children Do you feel safe going back to the place where you live?  No (SNF recommended.) Need for family participation in patient care:  Yes (Comment)  Care giving concerns: Pt's care cannot be managed at home following hospital d/c.   Social Worker assessment / plan:  Pt hospitalized from home on 01/16/16 with closed right acetabular fx. Pt is unable to participate in d/c planning due to cognitive deficits. PT has recommended SNF at d/c. CSW has spoken with pt's son, [name], to assist with d/c planning. Son is in agreement with plan for SNF. SNF search has been initiated and bed offers provided. Son has chosen [facility] for placement. SNF has been contacted and dc plan confirmed. CSW will continue to follow to assist with d/c planning.  Employment status:  Retired Health and safety inspector:  Medicare PT Recommendations:  Skilled Nursing Facility Information / Referral to community resources:     Patient/Family's Response to care:  Son is in agreement with plan for SNF placement.  Patient/Family's Understanding of and Emotional Response to Diagnosis, Current Treatment, and Prognosis:  Son is aware of pt medical status. He feels  SNF placement is needed at this time. " I've done my best to care for my mother for many years. She needs more care than I can give her right now. " Support provided. Son appreciates assistance with d/c planning.  Emotional Assessment Appearance:  Appears stated age Attitude/Demeanor/Rapport:  Unable to Assess Affect (typically observed):  Unable to Assess Orientation:  Oriented to Self Alcohol / Substance use:  Not Applicable Psych involvement (Current and /or in the community):  No (Comment)  Discharge Needs  Concerns to be addressed:  Discharge Planning Concerns Readmission within the last 30 days:  No Current discharge risk:  None Barriers to Discharge:  No Barriers Identified   Royetta Asal, LCSW  [phone] 01/17/2016, 3:13 PM

## 2016-01-17 NOTE — Progress Notes (Signed)
PHARMACY NOTE -  CEFTRIAXONE  Pharmacy has been consulted to assist with dosing of Ceftriaxone for UTI.    Plan: Ceftriaxone 1gm IV q24h Need for further dosage adjustment appears unlikely at present.    Will sign off at this time.  Please reconsult if a change in clinical status warrants re-evaluation of dosage.  Terrilee Files, PharmD 01/16/29 @ 00:56

## 2016-01-17 NOTE — NC FL2 (Signed)
MEDICAID FL2 LEVEL OF CARE SCREENING TOOL     IDENTIFICATION  Patient Name: Joanne Thompson Birthdate: Mar 04, 1930 Sex: female Admission Date (Current Location): 01/16/2016  [facility] and [place] Number:  Producer, television/film/video and Address:  [facility],  [address], [place] [address]      Provider Number: [id number]  Attending Physician Name and Address:  Meredeth Ide, MD  Relative Name and Phone Number:       Current Level of Care: Hospital Recommended Level of Care: Skilled Nursing Facility Prior Approval Number:    Date Approved/Denied:   PASRR Number: [id number]  Discharge Plan: SNF    Current Diagnoses: Patient Active Problem List   Diagnosis Date Noted  . Closed nondisplaced fracture of anterior wall of right acetabulum (HCC)   . Closed right acetabular fracture (HCC) 01/16/2016  . Anemia, B12 deficiency 10/28/2015  . COPD with exacerbation (HCC) 10/28/2015  . Diabetes mellitus type II, non insulin dependent (HCC) 07/30/2015  . Anxiety disorder 07/30/2015  . CAD S/P percutaneous coronary angioplasty 09/14/2014  . Essential hypertension 09/14/2014  . Ischemic cardiomyopathy, mild 09/14/2014  . Hyperglycemia 09/14/2014  . NSVT (nonsustained ventricular tachycardia) (HCC) 09/14/2014  . Moderate dementia with behavioral disturbance   . Aortic stenosis   . ST elevation (STEMI) myocardial infarction involving left anterior descending coronary artery (HCC) 09/12/2014  . Mild aortic stenosis by prior echocardiogram 08/17/2014    Orientation RESPIRATION BLADDER Height & Weight     Self  Normal Incontinent Weight: 99 lb (44.9 kg) Height:  5\' 6"  (167.6 cm)  BEHAVIORAL SYMPTOMS/MOOD NEUROLOGICAL BOWEL NUTRITION STATUS  Other (Comment) (Pt can be agitated at times d/t dementia)   Continent Diet  AMBULATORY STATUS COMMUNICATION OF NEEDS Skin   Extensive Assist Verbally Normal                       Personal Care Assistance Level  of Assistance  Bathing, Feeding, Dressing Bathing Assistance: Maximum assistance Feeding assistance: Limited assistance Dressing Assistance: Maximum assistance     Functional Limitations Info  Sight, Hearing, Speech Sight Info: Adequate Hearing Info: Adequate Speech Info: Adequate    SPECIAL CARE FACTORS FREQUENCY  PT (By licensed PT), OT (By licensed OT)     PT Frequency: 5x wk OT Frequency: 5x wk            Contractures Contractures Info: Not present    Additional Factors Info  Code Status Code Status Info: Full Code             Current Medications (01/17/2016):  This is the current hospital active medication list Current Facility-Administered Medications  Medication Dose Route Frequency Provider Last Rate Last Dose  . aspirin EC tablet 81 mg  81 mg Oral BID Alexis Hugelmeyer, DO   81 mg at 01/17/16 1122  . atorvastatin (LIPITOR) tablet 40 mg  40 mg Oral QODAY Alexis Hugelmeyer, DO   40 mg at 01/17/16 1122  . bisacodyl (DULCOLAX) EC tablet 5 mg  5 mg Oral Daily PRN Alexis Hugelmeyer, DO      . carvedilol (COREG) tablet 6.25 mg  6.25 mg Oral BID WC Alexis Hugelmeyer, DO   6.25 mg at 01/17/16 1122  . cefTRIAXone (ROCEPHIN) 1 g in dextrose 5 % 50 mL IVPB  1 g Intravenous Q24H Leann T Poindexter, RPH      . cholecalciferol (VITAMIN D) tablet 200 Units  200 Units Oral Daily Alexis Hugelmeyer, DO   200  Units at 01/17/16 1123  . docusate sodium (COLACE) capsule 100 mg  100 mg Oral BID Alexis Hugelmeyer, DO   100 mg at 01/17/16 1122  . enoxaparin (LOVENOX) injection 40 mg  40 mg Subcutaneous Q24H Alexis Hugelmeyer, DO   40 mg at 01/17/16 1123  . HYDROcodone-acetaminophen (NORCO/VICODIN) 5-325 MG per tablet 1-2 tablet  1-2 tablet Oral Q6H PRN Alexis Hugelmeyer, DO   2 tablet at 01/17/16 1122  . insulin aspart (novoLOG) injection 0-5 Units  0-5 Units Subcutaneous QHS Alexis Hugelmeyer, DO      . insulin aspart (novoLOG) injection 0-9 Units  0-9 Units Subcutaneous TID WC Alexis  Hugelmeyer, DO      . loperamide (IMODIUM) capsule 2 mg  2 mg Oral PRN Alexis Hugelmeyer, DO      . magnesium citrate solution 1 Bottle  1 Bottle Oral Once PRN Alexis Hugelmeyer, DO      . mometasone-formoterol (DULERA) 200-5 MCG/ACT inhaler 2 puff  2 puff Inhalation BID Alexis Hugelmeyer, DO      . morphine 2 MG/ML injection 0.5 mg  0.5 mg Intravenous Q2H PRN Alexis Hugelmeyer, DO   0.5 mg at 01/17/16 0150  . nitroGLYCERIN (NITROSTAT) SL tablet 0.4 mg  0.4 mg Sublingual Q5 min PRN Alexis Hugelmeyer, DO      . ramipril (ALTACE) capsule 2.5 mg  2.5 mg Oral Daily Alexis Hugelmeyer, DO   2.5 mg at 01/17/16 1124  . senna-docusate (Senokot-S) tablet 1 tablet  1 tablet Oral QHS PRN Alexis Hugelmeyer, DO      . ticagrelor (BRILINTA) tablet 90 mg  90 mg Oral BID Alexis Hugelmeyer, DO   90 mg at 01/17/16 1124  . vitamin B-12 (CYANOCOBALAMIN) tablet 2,500 mcg  2,500 mcg Oral Daily Alexis Hugelmeyer, DO   2,500 mcg at 01/17/16 1122     Discharge Medications: Please see discharge summary for a list of discharge medications.  Relevant Imaging Results:  Relevant Lab Results:   Additional Information SS # [id number]  Leandrea Ackley, Dickey Gave, LCSW

## 2016-01-17 NOTE — Progress Notes (Signed)
[facility]  PROGRESS NOTE  Joanne Thompson UJW:[id number] DOB: Jul 27, 1930 DOA: 01/16/2016 PCP: Betty Swaz[place], MD   Brief HPI:    83 y.o. female with a known history of asthma, HTN, HLD, dementia, depression, ischemic cardiomyopathy, NSVT, CAD s/p STEMI (8/16) presents to the emergency department for evaluation of hip pain s/p mechanical fall.  Per records, patient was in a usual state of health until this afternoon when she sustained a witnessed mechanical fall. Patient lives at home with a caregiver who indicated that she tripped and fell, landed on her right side, with no head trauma or LOC.    Subjective   Patient complains of pain, mittens in place.   Assessment/Plan:     1. Closed right acetabular fracture.  - Pain control, NWB pending ortho - Ortho consult - Dr. Magnus Ivan contacted by EDP. Nonsurgical management.  - PT eval for dispo planning. - DVT Px with Lovenox and SCDs  2. UTI - IV Rocephin, follow up cultures  3. Hyperglycemia, no history of diabetes - sliding scale insulin  4. History of CAD - Continue aspirin, statin, Brilinta, nitro  5. History of hypertension - Contineu Coreg, Ramipril  6. History of dementia/depression - Continue Aricept, Prozac  7. History of asthma - Continue Symbicort - O2, Duonebs as needed    DVT prophylaxis: Lovenox  Code Status: Full code  Family Communication: No family at bedside  Disposition Plan: SNF, when bed becomes available   Consultants:  None   Procedures:  None   Continuous infusions     Antibiotics:   Anti-infectives    Start     Dose/Rate Route Frequency Ordered Stop   01/17/16 2000  cefTRIAXone (ROCEPHIN) 1 g in dextrose 5 % 50 mL IVPB     1 g 100 mL/hr over 30 Minutes Intravenous Every 24 hours 01/17/16 0054     01/16/16 2000  cefTRIAXone (ROCEPHIN) 1 g in dextrose 5 % 50 mL IVPB     1 g 100 mL/hr over 30 Minutes Intravenous  Once 01/16/16 1954 01/16/16 2244        Objective   Vitals:   01/17/16 0125 01/17/16 0245 01/17/16 0526 01/17/16 1000  BP: (!) 120/106 (!) 143/62 (!) 124/109 95/75  Pulse: 89 79 77 93  Resp: 18 18 18 17   Temp: 97.9 F (36.6 C) 98.2 F (36.8 C) 97.7 F (36.5 C) 97.7 F (36.5 C)  TempSrc: Axillary Axillary Axillary Axillary  SpO2: 100% 100% 100% 99%  Weight:      Height:        Intake/Output Summary (Last 24 hours) at 01/17/16 1346 Last data filed at 01/17/16 1000  Gross per 24 hour  Intake              600 ml  Output              925 ml  Net             -325 ml   Filed Weights   01/16/16 1546 01/16/16 1612  Weight: 45.4 kg (100 lb) 44.9 kg (99 lb)     Physical Examination:  General exam: Appears calm and comfortable. Respiratory system: Clear to auscultation. Respiratory effort normal. Cardiovascular system:  RRR. No  murmurs, rubs, gallops. No pedal edema. GI system: Abdomen is nondistended, soft and nontender. No organomegaly.  Central nervous system. No focal neurological deficits. 5 x 5 power in all extremities. Skin: No rashes, lesions or ulcers. Psychiatry: Alert,confused    Data Reviewed:  I have personally reviewed following labs and imaging studies  CBG:  Recent Labs Lab 01/16/16 1558 01/17/16 0301 01/17/16 0812 01/17/16 1149  GLUCAP 272* 156* 107* 260*    CBC:  Recent Labs Lab 01/16/16 1815 01/17/16 0428  WBC 10.5 11.6*  NEUTROABS 9.4*  --   HGB 11.3* 11.6*  HCT 33.9* 34.8*  MCV 81.3 81.3  PLT 242 246    Basic Metabolic Panel:  Recent Labs Lab 01/16/16 1815 01/17/16 0428  NA 132* 133*  K 3.8 4.3  CL 101 101  CO2 24 24  GLUCOSE 143* 151*  BUN 37* 26*  CREATININE 0.80 0.61  CALCIUM 8.4* 8.3*    No results found for this or any previous visit (from the past 240 hour(s)).   Liver Function Tests:  Recent Labs Lab 01/16/16 1815  AST 27  ALT 22  ALKPHOS 72  BILITOT 1.5*  PROT 7.5  ALBUMIN 3.2*   No results for input(s): LIPASE, AMYLASE in the last  168 hours. No results for input(s): AMMONIA in the last 168 hours.  Cardiac Enzymes: No results for input(s): CKTOTAL, CKMB, CKMBINDEX, TROPONINI in the last 168 hours. BNP (last 3 results) No results for input(s): BNP in the last 8760 hours.  ProBNP (last 3 results) No results for input(s): PROBNP in the last 8760 hours.    Studies: Dg Chest 1 View  Result Date: 01/16/2016 CLINICAL DATA:  History of asthma and hypertension EXAM: CHEST 1 VIEW COMPARISON:  06/29/2015, CT chest 09/29/2014, radiograph chest 09/12/2014 FINDINGS: AP supine view chest. Oval mass in the apical portion of the left lung, corresponding to prior CT mass again demonstrated. The lungs are hyperinflated. No acute infiltrate or effusion. Right apical pleural thickening. Stable borderline to mild cardiomegaly. No pneumothorax. Stable sclerotic lesion mid right humerus. Old right-sided rib fractures. IMPRESSION: 1. Large oval mass/opacity in the apical portion of the left upper lobe as previously described. 2. Mild hyperinflation.  No acute infiltrate. Electronically Signed   By: Jasmine Pang M.D.   On: 01/16/2016 19:19   Ct Pelvis Wo Contrast  Result Date: 01/16/2016 CLINICAL DATA:  Fall.  Concern for RIGHT hip fracture. EXAM: CT PELVIS WITHOUT CONTRAST TECHNIQUE: Multidetector CT imaging of the pelvis was performed following the standard protocol without intravenous contrast. COMPARISON:  None. FINDINGS: Urinary Tract:  Unremarkable Bowel:  Unremarkable Vascular/Lymphatic: This calcification aorta and branches. No lymphadenopathy Reproductive:  Uterus and ovaries grossly normal as noncontrast exam Other:  No free-fluid in the pelvis Musculoskeletal: There is a complex fracture of the RIGHT acetabulum involving the posterior wall, anterior wall and medial wall. There is minimal displacement of the fracture fragments. There is small hematoma within the RIGHT operator muscle which measures 16 mm in thickness. No fracture of the  RIGHT femoral neck. Remote fracture of the LEFT superior pubic ramus. IMPRESSION: 1. Complex minimally displaced fracture of the RIGHT acetabulum involving the anterior wall, posterior wall and medial wall. 2. Small hematoma in the RIGHT operator muscle. Electronically Signed   By: Genevive Bi M.D.   On: 01/16/2016 19:14   Dg Hip Unilat With Pelvis 2-3 Views Right  Result Date: 01/16/2016 CLINICAL DATA:  Status post fall today. EXAM: DG HIP (WITH OR WITHOUT PELVIS) 2-3V RIGHT COMPARISON:  None. FINDINGS: There is no evidence of hip fracture or dislocation. There is chronic deformity left superior and inferior pubic rami. Degenerative joint changes of bilateral hips and visualized lumbar spine are noted. IMPRESSION: No acute fracture or dislocation. Electronically Signed  By: Sherian Rein M.D.   On: 01/16/2016 17:36    Scheduled Meds: . aspirin EC  81 mg Oral BID  . atorvastatin  40 mg Oral QODAY  . carvedilol  6.25 mg Oral BID WC  . cefTRIAXone (ROCEPHIN)  IV  1 g Intravenous Q24H  . cholecalciferol  200 Units Oral Daily  . docusate sodium  100 mg Oral BID  . enoxaparin (LOVENOX) injection  40 mg Subcutaneous Q24H  . insulin aspart  0-5 Units Subcutaneous QHS  . insulin aspart  0-9 Units Subcutaneous TID WC  . mometasone-formoterol  2 puff Inhalation BID  . ramipril  2.5 mg Oral Daily  . ticagrelor  90 mg Oral BID  . vitamin B-12  2,500 mcg Oral Daily      Time spent: 25 min  [facility]   [facility] Pager [phone]. If 7PM-7AM, please contact night-coverage at [email or web address], Office  [phone]  password [email or web address] 01/17/2016, 1:46 PM  LOS: 1 day

## 2016-01-17 NOTE — Clinical Social Work Placement (Signed)
   CLINICAL SOCIAL WORK PLACEMENT  NOTE  Date:  01/17/2016  Patient Details  Name: Joanne Thompson MRN: [id number] Date of Birth: 1930/01/25  Clinical Social Work is seeking post-discharge placement for this patient at the Skilled  Nursing Facility level of care (*CSW will initial, date and re-position this form in  chart as items are completed):  Yes   Patient/family provided with [facility] Clinical Social Work Department's list of facilities offering this level of care within the geographic area requested by the patient (or if unable, by the patient's family).  Yes   Patient/family informed of their freedom to choose among providers that offer the needed level of care, that participate in Medicare, Medicaid or managed care program needed by the patient, have an available bed and are willing to accept the patient.  Yes   Patient/family informed of [facility]'s ownership interest in [facility] and [facility], as well as of the fact that they are under no obligation to receive care at these facilities.  PASRR submitted to EDS on 01/17/16     PASRR number received on 01/17/16     Existing PASRR number confirmed on       FL2 transmitted to all facilities in geographic area requested by pt/family on 01/17/16     FL2 transmitted to all facilities within larger geographic area on       Patient informed that his/her managed care company has contracts with or will negotiate with certain facilities, including the following:        Yes   Patient/family informed of bed offers received.  Patient chooses bed at [facility]     Physician recommends and patient chooses bed at      Patient to be transferred to [facility] on  .  Patient to be transferred to facility by       Patient family notified on   of transfer.  Name of family member notified:        PHYSICIAN       Additional Comment:     _______________________________________________ Royetta Asal, LCSW  [phone] 01/17/2016, 3:25 PM

## 2016-01-18 LAB — GLUCOSE, CAPILLARY
GLUCOSE-CAPILLARY: 125 mg/dL — AB (ref 65–99)
GLUCOSE-CAPILLARY: 216 mg/dL — AB (ref 65–99)
Glucose-Capillary: 127 mg/dL — ABNORMAL HIGH (ref 65–99)
Glucose-Capillary: 191 mg/dL — ABNORMAL HIGH (ref 65–99)

## 2016-01-18 MED ORDER — HALOPERIDOL LACTATE 5 MG/ML IJ SOLN
5.0000 mg | Freq: Once | INTRAMUSCULAR | Status: AC
  Administered 2016-01-18: 16:00:00 5 mg via INTRAVENOUS
  Filled 2016-01-18: qty 1

## 2016-01-18 NOTE — Progress Notes (Signed)
[facility]  PROGRESS NOTE  Joanne Thompson WUJ:[id number] DOB: 22-Mar-1930 DOA: 01/16/2016 PCP: Betty Swaziland, MD   Brief HPI:    83 y.o. female with a known history of asthma, HTN, HLD, dementia, depression, ischemic cardiomyopathy, NSVT, CAD s/p STEMI (8/16) presents to the emergency department for evaluation of hip pain s/p mechanical fall.  Per records, patient was in a usual state of health until this afternoon when she sustained a witnessed mechanical fall. Patient lives at home with a caregiver who indicated that she tripped and fell, landed on her right side, with no head trauma or LOC.    Subjective   Patient complains of pain, mittens in place.   Assessment/Plan:     1. Closed right acetabular fracture.  - Pain control, NWB pending ortho - Ortho consult - Dr. Magnus Ivan contacted by EDP. Nonsurgical management.  - PT eval for dispo planning. - DVT Px with Lovenox and SCDs  2. UTI - IV Rocephin, follow up cultures  3. Hyperglycemia, no history of diabetes - sliding scale insulin  4. History of CAD - Continue aspirin, statin, Brilinta, nitro  5. History of hypertension - Contineu Coreg, Ramipril  6. History of dementia With behavior disturbance -  Haldol 5 mg IV 1 - Continue Aricept, Prozac  7. History of asthma - Continue Symbicort - O2, Duonebs as needed    DVT prophylaxis: Lovenox  Code Status: Full code  Family Communication: No family at bedside  Disposition Plan: SNF, when bed becomes available   Consultants:  None   Procedures:  None   Continuous infusions     Antibiotics:   Anti-infectives    Start     Dose/Rate Route Frequency Ordered Stop   01/17/16 2000  cefTRIAXone (ROCEPHIN) 1 g in dextrose 5 % 50 mL IVPB     1 g 100 mL/hr over 30 Minutes Intravenous Every 24 hours 01/17/16 0054     01/16/16 2000  cefTRIAXone (ROCEPHIN) 1 g in dextrose 5 % 50 mL IVPB     1 g 100 mL/hr over 30 Minutes Intravenous  Once  01/16/16 1954 01/16/16 2244       Objective   Vitals:   01/18/16 0500 01/18/16 0602 01/18/16 0916 01/18/16 1345  BP:  140/67 (!) 145/69 128/64  Pulse:   78 61  Resp: 16 16  17   Temp: 98.5 F (36.9 C) 98.5 F (36.9 C)  98 F (36.7 C)  TempSrc: Oral Oral  Oral  SpO2: 100% 100% 95% 97%  Weight:      Height:        Intake/Output Summary (Last 24 hours) at 01/18/16 1631 Last data filed at 01/18/16 1500  Gross per 24 hour  Intake              960 ml  Output              595 ml  Net              365 ml   Filed Weights   01/16/16 1546 01/16/16 1612  Weight: 45.4 kg (100 lb) 44.9 kg (99 lb)     Physical Examination:  General exam: Appears calm and comfortable. Respiratory system: Clear to auscultation. Respiratory effort normal. Cardiovascular system:  RRR. No  murmurs, rubs, gallops. No pedal edema. GI system: Abdomen is nondistended, soft and nontender. No organomegaly.  Central nervous system. No focal neurological deficits. 5 x 5 power in all extremities. Skin: No rashes, lesions or ulcers. Psychiatry:  Alert,confused    Data Reviewed: I have personally reviewed following labs and imaging studies  CBG:  Recent Labs Lab 01/17/16 1149 01/17/16 1632 01/17/16 2117 01/18/16 0812 01/18/16 1203  GLUCAP 260* 155* 124* 125* 216*    CBC:  Recent Labs Lab 01/16/16 1815 01/17/16 0428  WBC 10.5 11.6*  NEUTROABS 9.4*  --   HGB 11.3* 11.6*  HCT 33.9* 34.8*  MCV 81.3 81.3  PLT 242 246    Basic Metabolic Panel:  Recent Labs Lab 01/16/16 1815 01/17/16 0428  NA 132* 133*  K 3.8 4.3  CL 101 101  CO2 24 24  GLUCOSE 143* 151*  BUN 37* 26*  CREATININE 0.80 0.61  CALCIUM 8.4* 8.3*    No results found for this or any previous visit (from the past 240 hour(s)).   Liver Function Tests:  Recent Labs Lab 01/16/16 1815  AST 27  ALT 22  ALKPHOS 72  BILITOT 1.5*  PROT 7.5  ALBUMIN 3.2*   No results for input(s): LIPASE, AMYLASE in the last 168  hours. No results for input(s): AMMONIA in the last 168 hours.  Cardiac Enzymes: No results for input(s): CKTOTAL, CKMB, CKMBINDEX, TROPONINI in the last 168 hours. BNP (last 3 results) No results for input(s): BNP in the last 8760 hours.  ProBNP (last 3 results) No results for input(s): PROBNP in the last 8760 hours.    Studies: Dg Chest 1 View  Result Date: 01/16/2016 CLINICAL DATA:  History of asthma and hypertension EXAM: CHEST 1 VIEW COMPARISON:  06/29/2015, CT chest 09/29/2014, radiograph chest 09/12/2014 FINDINGS: AP supine view chest. Oval mass in the apical portion of the left lung, corresponding to prior CT mass again demonstrated. The lungs are hyperinflated. No acute infiltrate or effusion. Right apical pleural thickening. Stable borderline to mild cardiomegaly. No pneumothorax. Stable sclerotic lesion mid right humerus. Old right-sided rib fractures. IMPRESSION: 1. Large oval mass/opacity in the apical portion of the left upper lobe as previously described. 2. Mild hyperinflation.  No acute infiltrate. Electronically Signed   By: Jasmine Pang M.D.   On: 01/16/2016 19:19   Ct Pelvis Wo Contrast  Result Date: 01/16/2016 CLINICAL DATA:  Fall.  Concern for RIGHT hip fracture. EXAM: CT PELVIS WITHOUT CONTRAST TECHNIQUE: Multidetector CT imaging of the pelvis was performed following the standard protocol without intravenous contrast. COMPARISON:  None. FINDINGS: Urinary Tract:  Unremarkable Bowel:  Unremarkable Vascular/Lymphatic: This calcification aorta and branches. No lymphadenopathy Reproductive:  Uterus and ovaries grossly normal as noncontrast exam Other:  No free-fluid in the pelvis Musculoskeletal: There is a complex fracture of the RIGHT acetabulum involving the posterior wall, anterior wall and medial wall. There is minimal displacement of the fracture fragments. There is small hematoma within the RIGHT operator muscle which measures 16 mm in thickness. No fracture of the  RIGHT femoral neck. Remote fracture of the LEFT superior pubic ramus. IMPRESSION: 1. Complex minimally displaced fracture of the RIGHT acetabulum involving the anterior wall, posterior wall and medial wall. 2. Small hematoma in the RIGHT operator muscle. Electronically Signed   By: Genevive Bi M.D.   On: 01/16/2016 19:14   Dg Hip Unilat With Pelvis 2-3 Views Right  Result Date: 01/16/2016 CLINICAL DATA:  Status post fall today. EXAM: DG HIP (WITH OR WITHOUT PELVIS) 2-3V RIGHT COMPARISON:  None. FINDINGS: There is no evidence of hip fracture or dislocation. There is chronic deformity left superior and inferior pubic rami. Degenerative joint changes of bilateral hips and visualized lumbar spine are  noted. IMPRESSION: No acute fracture or dislocation. Electronically Signed   By: Sherian Rein M.D.   On: 01/16/2016 17:36    Scheduled Meds: . aspirin EC  81 mg Oral BID  . atorvastatin  40 mg Oral QODAY  . carvedilol  6.25 mg Oral BID WC  . cefTRIAXone (ROCEPHIN)  IV  1 g Intravenous Q24H  . cholecalciferol  200 Units Oral Daily  . docusate sodium  100 mg Oral BID  . enoxaparin (LOVENOX) injection  40 mg Subcutaneous Q24H  . insulin aspart  0-5 Units Subcutaneous QHS  . insulin aspart  0-9 Units Subcutaneous TID WC  . mometasone-formoterol  2 puff Inhalation BID  . ramipril  2.5 mg Oral Daily  . ticagrelor  90 mg Oral BID  . vitamin B-12  2,500 mcg Oral Daily      Time spent: 25 min  [facility]   [facility] Pager [phone]. If 7PM-7AM, please contact night-coverage at [email or web address], Office  [phone]  password [email or web address] 01/18/2016, 4:31 PM  LOS: 2 days

## 2016-01-18 NOTE — Progress Notes (Signed)
Plan for d/c to SNF, discharge planning per CSW. [phone] 

## 2016-01-19 DIAGNOSIS — R531 Weakness: Secondary | ICD-10-CM | POA: Diagnosis not present

## 2016-01-19 DIAGNOSIS — N3000 Acute cystitis without hematuria: Secondary | ICD-10-CM | POA: Diagnosis not present

## 2016-01-19 DIAGNOSIS — E785 Hyperlipidemia, unspecified: Secondary | ICD-10-CM | POA: Diagnosis not present

## 2016-01-19 DIAGNOSIS — F0390 Unspecified dementia without behavioral disturbance: Secondary | ICD-10-CM | POA: Diagnosis not present

## 2016-01-19 DIAGNOSIS — Z9861 Coronary angioplasty status: Secondary | ICD-10-CM | POA: Diagnosis not present

## 2016-01-19 DIAGNOSIS — N390 Urinary tract infection, site not specified: Secondary | ICD-10-CM | POA: Diagnosis not present

## 2016-01-19 DIAGNOSIS — R2681 Unsteadiness on feet: Secondary | ICD-10-CM | POA: Diagnosis not present

## 2016-01-19 DIAGNOSIS — S32414D Nondisplaced fracture of anterior wall of right acetabulum, subsequent encounter for fracture with routine healing: Secondary | ICD-10-CM | POA: Diagnosis not present

## 2016-01-19 DIAGNOSIS — G301 Alzheimer's disease with late onset: Secondary | ICD-10-CM | POA: Diagnosis not present

## 2016-01-19 DIAGNOSIS — S32414A Nondisplaced fracture of anterior wall of right acetabulum, initial encounter for closed fracture: Secondary | ICD-10-CM | POA: Diagnosis not present

## 2016-01-19 DIAGNOSIS — S329XXA Fracture of unspecified parts of lumbosacral spine and pelvis, initial encounter for closed fracture: Secondary | ICD-10-CM | POA: Diagnosis not present

## 2016-01-19 DIAGNOSIS — J45909 Unspecified asthma, uncomplicated: Secondary | ICD-10-CM | POA: Diagnosis not present

## 2016-01-19 DIAGNOSIS — D519 Vitamin B12 deficiency anemia, unspecified: Secondary | ICD-10-CM | POA: Diagnosis not present

## 2016-01-19 DIAGNOSIS — F0281 Dementia in other diseases classified elsewhere with behavioral disturbance: Secondary | ICD-10-CM | POA: Diagnosis not present

## 2016-01-19 DIAGNOSIS — E119 Type 2 diabetes mellitus without complications: Secondary | ICD-10-CM | POA: Diagnosis not present

## 2016-01-19 DIAGNOSIS — I2510 Atherosclerotic heart disease of native coronary artery without angina pectoris: Secondary | ICD-10-CM | POA: Diagnosis not present

## 2016-01-19 DIAGNOSIS — F419 Anxiety disorder, unspecified: Secondary | ICD-10-CM | POA: Diagnosis not present

## 2016-01-19 DIAGNOSIS — I10 Essential (primary) hypertension: Secondary | ICD-10-CM | POA: Diagnosis not present

## 2016-01-19 DIAGNOSIS — R488 Other symbolic dysfunctions: Secondary | ICD-10-CM | POA: Diagnosis not present

## 2016-01-19 DIAGNOSIS — F329 Major depressive disorder, single episode, unspecified: Secondary | ICD-10-CM | POA: Diagnosis not present

## 2016-01-19 DIAGNOSIS — R1312 Dysphagia, oropharyngeal phase: Secondary | ICD-10-CM | POA: Diagnosis not present

## 2016-01-19 DIAGNOSIS — Z9181 History of falling: Secondary | ICD-10-CM | POA: Diagnosis not present

## 2016-01-19 DIAGNOSIS — R739 Hyperglycemia, unspecified: Secondary | ICD-10-CM | POA: Diagnosis not present

## 2016-01-19 DIAGNOSIS — M6281 Muscle weakness (generalized): Secondary | ICD-10-CM | POA: Diagnosis not present

## 2016-01-19 LAB — GLUCOSE, CAPILLARY
GLUCOSE-CAPILLARY: 155 mg/dL — AB (ref 65–99)
Glucose-Capillary: 122 mg/dL — ABNORMAL HIGH (ref 65–99)

## 2016-01-19 MED ORDER — INSULIN ASPART 100 UNIT/ML ~~LOC~~ SOLN: 0.0000 [IU] | Freq: Three times a day (TID) | SUBCUTANEOUS | 11 refills | Status: DC

## 2016-01-19 MED ORDER — CEPHALEXIN 500 MG PO CAPS: 500.0000 mg | ORAL_CAPSULE | Freq: Two times a day (BID) | ORAL | 0 refills | Status: AC

## 2016-01-19 MED ORDER — HYDROCODONE-ACETAMINOPHEN 5-325 MG PO TABS: 1.0000 | ORAL_TABLET | Freq: Four times a day (QID) | ORAL | 0 refills | Status: DC | PRN

## 2016-01-19 NOTE — Progress Notes (Signed)
Pt has a SNF bed at [facility] today if stable for d/c. CSW will follow to assist with d/c planning.  Cori Razor LCSW [phone]

## 2016-01-19 NOTE — Discharge Summary (Signed)
Physician Discharge Summary  Joanne Thompson:[id number] DOB: April 07, 1930 DOA: 01/16/2016  PCP: Betty Swaziland, MD  Admit date: 01/16/2016 Discharge date: 01/19/2016  Time spent: 25* minutes  Recommendations for Outpatient Follow-up:  1. Follow Orthopedics in 2 weeks   Discharge Diagnoses:  Active Problems:   Closed right acetabular fracture (HCC)   Closed nondisplaced fracture of anterior wall of right acetabulum [facility])   Discharge Condition: Stable  Diet recommendation: Carb modified diet  Filed Weights   01/16/16 1546 01/16/16 1612  Weight: 45.4 kg (100 lb) 44.9 kg (99 lb)    History of present illness:  83 y.o.femalewith a known history of asthma, HTN, HLD, dementia, depression, ischemic cardiomyopathy, NSVT, CAD s/p STEMI (8/16)presents to the emergency department for evaluation of hip pain s/p mechanical fall. Per records, patientwas in a usual state of health until this afternoon when she sustained a witnessed mechanical fall. Patient lives at home with a caregiver who indicated that she tripped and fell, landed on her right side, with no head trauma or LOC.   Hospital Course:  Closed right acetabular fracture.  - Pain control, NWB for 8-12 weeks  pending ortho Follow up Ortho in 2 weeks Patient already on aspirin and Brillinta for DVT prophylaxis    2. UTI - IV Rocephin was started , urine culture not obtained at the time of admission -  Will discharge on Po Keflex 500 mg bid for 3 more days. Stop on 01/22/16  3. Hyperglycemia, no history of diabetes - Hba1c elevated 7.7, will send on sliding scale insulin.  4. History of CAD - Continue aspirin, statin, Brilinta, nitro  5. History of hypertension - Contineu Coreg, Ramipril  6. History of dementia With behavior disturbance - Continue Aricept, Prozac  7. History of asthma - Continue Symbicort   Procedures:  None   Consultations:  Orthopedics  Discharge Exam: Vitals:   01/18/16 2122  01/19/16 0606  BP: (!) 151/94 139/66  Pulse: 100 84  Resp: 16 16  Temp: 98.4 F (36.9 C) 97.8 F (36.6 C)    General: Appears in no acute distress Cardiovascular: RRR, S1S2 Respiratory: Clear bilaterally  Discharge Instructions   Discharge Instructions    Diet - low sodium heart healthy    Complete by:  As directed    Increase activity slowly    Complete by:  As directed    Non weight bearing    Complete by:  As directed    Laterality:  right   Extremity:  Lower     Current Discharge Medication List    START taking these medications   Details  HYDROcodone-acetaminophen (NORCO/VICODIN) 5-325 MG tablet Take 1-2 tablets by mouth every 6 (six) hours as needed for moderate pain. Qty: 30 tablet, Refills: 0    insulin aspart (NOVOLOG) 100 UNIT/ML injection Inject 0-9 Units into the skin 3 (three) times daily with meals. Sliding scale insulin Less than 70 initiate hypoglycemia protocol 70-120  0 units 120-150 1 unit 151-200 2 units 201-250 3 units 251-300 5 units 301-350 7 units 351-400 9 units  Greater than 400 call MD Qty: 10 mL, Refills: 11      CONTINUE these medications which have NOT CHANGED   Details  acetaminophen (TYLENOL) 325 MG tablet Take 650 mg by mouth every 6 (six) hours as needed for mild pain.    aspirin EC 81 MG EC tablet Take 1 tablet (81 mg total) by mouth daily. Qty: 30 tablet, Refills: 11    atorvastatin (LIPITOR) 80  MG tablet TAKE 1 TABLET (80 MG TOTAL) BY MOUTH EVERY EVENING. Qty: 30 tablet, Refills: 6    budesonide-formoterol (SYMBICORT) 160-4.5 MCG/ACT inhaler Inhale 2 puffs into the lungs 2 (two) times daily. Qty: 3 Inhaler, Refills: 2   Associated Diagnoses: COPD with exacerbation (HCC)    carvedilol (COREG) 6.25 MG tablet Take 1 tablet (6.25 mg total) by mouth 2 (two) times daily with a meal. Qty: 180 tablet, Refills: 3    cholecalciferol (VITAMIN D) 1000 units tablet Take 200 Units by mouth daily.    Cyanocobalamin (B-12) 2500  MCG TABS Take 2,500 mcg by mouth daily.    loperamide (IMODIUM) 2 MG capsule Take 2 mg by mouth as needed for diarrhea or loose stools.    nitroGLYCERIN (NITROSTAT) 0.4 MG SL tablet Place 1 tablet (0.4 mg total) under the tongue every 5 (five) minutes as needed for chest pain (up to 3 doses). Qty: 25 tablet, Refills: 3    Pumpkin Seed-Soy Germ (AZO BLADDER CONTROL/GO-LESS PO) Take 1 capsule by mouth 3 times/day as needed-between meals & bedtime.    ramipril (ALTACE) 2.5 MG capsule Take 1 capsule (2.5 mg total) by mouth 2 (two) times daily. NEED OV. Qty: 60 capsule, Refills: 0   Associated Diagnoses: Essential hypertension    ticagrelor (BRILINTA) 90 MG TABS tablet Take 1 tablet (90 mg total) by mouth 2 (two) times daily. Qty: 24 tablet, Refills: 0    donepezil (ARICEPT) 10 MG tablet Take 1 tablet (10 mg total) by mouth at bedtime. Qty: 90 tablet, Refills: 1   Associated Diagnoses: Moderate dementia with behavioral disturbance    FLUoxetine (PROZAC) 20 MG tablet Take 1 tablet (20 mg total) by mouth daily. Qty: 30 tablet, Refills: 3   Associated Diagnoses: Anxiety disorder, unspecified    Keflex 500 mg po BID x 3 days     No Known Allergies Follow-up Information    Kathryne Hitch, MD. Schedule an appointment as soon as possible for a visit in 2 week(s).   Specialty:  Orthopedic Surgery Contact information: [address] [place] [address] [phone]            The results of significant diagnostics from this hospitalization (including imaging, microbiology, ancillary and laboratory) are listed below for reference.    Significant Diagnostic Studies: Dg Chest 1 View  Result Date: 01/16/2016 CLINICAL DATA:  History of asthma and hypertension EXAM: CHEST 1 VIEW COMPARISON:  06/29/2015, CT chest 09/29/2014, radiograph chest 09/12/2014 FINDINGS: AP supine view chest. Oval mass in the apical portion of the left lung, corresponding to prior CT mass again  demonstrated. The lungs are hyperinflated. No acute infiltrate or effusion. Right apical pleural thickening. Stable borderline to mild cardiomegaly. No pneumothorax. Stable sclerotic lesion mid right humerus. Old right-sided rib fractures. IMPRESSION: 1. Large oval mass/opacity in the apical portion of the left upper lobe as previously described. 2. Mild hyperinflation.  No acute infiltrate. Electronically Signed   By: Jasmine Pang M.D.   On: 01/16/2016 19:19   Ct Pelvis Wo Contrast  Result Date: 01/16/2016 CLINICAL DATA:  Fall.  Concern for RIGHT hip fracture. EXAM: CT PELVIS WITHOUT CONTRAST TECHNIQUE: Multidetector CT imaging of the pelvis was performed following the standard protocol without intravenous contrast. COMPARISON:  None. FINDINGS: Urinary Tract:  Unremarkable Bowel:  Unremarkable Vascular/Lymphatic: This calcification aorta and branches. No lymphadenopathy Reproductive:  Uterus and ovaries grossly normal as noncontrast exam Other:  No free-fluid in the pelvis Musculoskeletal: There is a complex fracture of  the RIGHT acetabulum involving the posterior wall, anterior wall and medial wall. There is minimal displacement of the fracture fragments. There is small hematoma within the RIGHT operator muscle which measures 16 mm in thickness. No fracture of the RIGHT femoral neck. Remote fracture of the LEFT superior pubic ramus. IMPRESSION: 1. Complex minimally displaced fracture of the RIGHT acetabulum involving the anterior wall, posterior wall and medial wall. 2. Small hematoma in the RIGHT operator muscle. Electronically Signed   By: Genevive Bi M.D.   On: 01/16/2016 19:14   Dg Hip Unilat With Pelvis 2-3 Views Right  Result Date: 01/16/2016 CLINICAL DATA:  Status post fall today. EXAM: DG HIP (WITH OR WITHOUT PELVIS) 2-3V RIGHT COMPARISON:  None. FINDINGS: There is no evidence of hip fracture or dislocation. There is chronic deformity left superior and inferior pubic rami. Degenerative  joint changes of bilateral hips and visualized lumbar spine are noted. IMPRESSION: No acute fracture or dislocation. Electronically Signed   By: Sherian Rein M.D.   On: 01/16/2016 17:36    Microbiology: No results found for this or any previous visit (from the past 240 hour(s)).   Labs: Basic Metabolic Panel:  Recent Labs Lab 01/16/16 1815 01/17/16 0428  NA 132* 133*  K 3.8 4.3  CL 101 101  CO2 24 24  GLUCOSE 143* 151*  BUN 37* 26*  CREATININE 0.80 0.61  CALCIUM 8.4* 8.3*   Liver Function Tests:  Recent Labs Lab 01/16/16 1815  AST 27  ALT 22  ALKPHOS 72  BILITOT 1.5*  PROT 7.5  ALBUMIN 3.2*   No results for input(s): LIPASE, AMYLASE in the last 168 hours. No results for input(s): AMMONIA in the last 168 hours. CBC:  Recent Labs Lab 01/16/16 1815 01/17/16 0428  WBC 10.5 11.6*  NEUTROABS 9.4*  --   HGB 11.3* 11.6*  HCT 33.9* 34.8*  MCV 81.3 81.3  PLT 242 246     CBG:  Recent Labs Lab 01/18/16 0812 01/18/16 1203 01/18/16 1638 01/18/16 2114 01/19/16 0915  GLUCAP 125* 216* 127* 191* 155*       Signed:  Mauro Kaufmann S MD.  [facility] 01/19/2016, 11:31 AM

## 2016-01-19 NOTE — Clinical Social Work Placement (Addendum)
   CLINICAL SOCIAL WORK PLACEMENT  NOTE  Date:  01/19/2016  Patient Details  Name: Joanne Thompson MRN: [id number] Date of Birth: [birth year]  Clinical Social Work is seeking post-discharge placement for this patient at the Skilled  Nursing Facility level of care (*CSW will initial, date and re-position this form in  chart as items are completed):  Yes   Patient/family provided with [facility] Clinical Social Work Department's list of facilities offering this level of care within the geographic area requested by the patient (or if unable, by the patient's family).  Yes   Patient/family informed of their freedom to choose among providers that offer the needed level of care, that participate in Medicare, Medicaid or managed care program needed by the patient, have an available bed and are willing to accept the patient.  Yes   Patient/family informed of [facility]'s ownership interest in [facility] and [facility], as well as of the fact that they are under no obligation to receive care at these facilities.  PASRR submitted to EDS on 01/17/16     PASRR number received on 01/17/16     Existing PASRR number confirmed on       FL2 transmitted to all facilities in geographic area requested by pt/family on 01/17/16     FL2 transmitted to all facilities within larger geographic area on       Patient informed that his/her managed care company has contracts with or will negotiate with certain facilities, including the following:        Yes   Patient/family informed of bed offers received.  Patient chooses bed at [facility]     Physician recommends and patient chooses bed at      Patient to be transferred to [facility] on 01/19/16.  Patient to be transferred to facility by [facility]     Patient family notified on 01/19/16 of transfer.  Name of family member notified:  SON     PHYSICIAN       Additional Comment:  Pt/son are in agreement with  d/c to [facility] today. [facility] transport required. Medical necessity form completed. Son is aware out of pocket costs may be associated with [facility] transport. D/C Summary sent SNF for review. Scripts included in d/c packet. # for report provided to nsg.   _______________________________________________ Royetta Asal, LCSW  [phone] 01/19/2016, 3:28 PM

## 2016-01-20 LAB — URINE CULTURE: CULTURE: NO GROWTH

## 2016-01-24 ENCOUNTER — Non-Acute Institutional Stay (SKILLED_NURSING_FACILITY): Payer: Medicare Other | Admitting: Internal Medicine

## 2016-01-24 ENCOUNTER — Encounter: Payer: Self-pay | Admitting: Internal Medicine

## 2016-01-24 DIAGNOSIS — G301 Alzheimer's disease with late onset: Secondary | ICD-10-CM | POA: Diagnosis not present

## 2016-01-24 DIAGNOSIS — J45909 Unspecified asthma, uncomplicated: Secondary | ICD-10-CM | POA: Diagnosis not present

## 2016-01-24 DIAGNOSIS — F02818 Dementia in other diseases classified elsewhere, unspecified severity, with other behavioral disturbance: Secondary | ICD-10-CM

## 2016-01-24 DIAGNOSIS — S32414D Nondisplaced fracture of anterior wall of right acetabulum, subsequent encounter for fracture with routine healing: Secondary | ICD-10-CM | POA: Diagnosis not present

## 2016-01-24 DIAGNOSIS — I2510 Atherosclerotic heart disease of native coronary artery without angina pectoris: Secondary | ICD-10-CM | POA: Diagnosis not present

## 2016-01-24 DIAGNOSIS — E119 Type 2 diabetes mellitus without complications: Secondary | ICD-10-CM

## 2016-01-24 DIAGNOSIS — F419 Anxiety disorder, unspecified: Secondary | ICD-10-CM | POA: Insufficient documentation

## 2016-01-24 DIAGNOSIS — I10 Essential (primary) hypertension: Secondary | ICD-10-CM | POA: Diagnosis not present

## 2016-01-24 DIAGNOSIS — D519 Vitamin B12 deficiency anemia, unspecified: Secondary | ICD-10-CM | POA: Diagnosis not present

## 2016-01-24 DIAGNOSIS — Z9861 Coronary angioplasty status: Secondary | ICD-10-CM | POA: Diagnosis not present

## 2016-01-24 DIAGNOSIS — N3000 Acute cystitis without hematuria: Secondary | ICD-10-CM

## 2016-01-24 DIAGNOSIS — F0281 Dementia in other diseases classified elsewhere with behavioral disturbance: Secondary | ICD-10-CM | POA: Diagnosis not present

## 2016-01-24 DIAGNOSIS — N390 Urinary tract infection, site not specified: Secondary | ICD-10-CM | POA: Insufficient documentation

## 2016-01-24 NOTE — Progress Notes (Deleted)
Provider:  Merrilee Seashore MD Location:  [facility] Room Number: [phone] Place of Service:  SNF ([phone])  PCP: Merrilee Seashore, MD Patient Care Team: Margit Hanks, MD as PCP - General (Internal Medicine)  Extended Emergency Contact Information Primary Emergency Contact: Scott,David Address: [address]          [place], [place] [address] [place] Home Phone: [phone] Relation: Son  Code Status: DNR Goals of Care: Advanced Directive information Advanced Directives 01/24/2016  Does Patient Have a Medical Advance Directive? No  Type of Advance Directive -  Would patient like information on creating a medical advance directive? No - Patient declined      Chief Complaint  Patient presents with  . New Admit To SNF    HPI: Patient is a 83 y.o. female seen today for admission to  Past Medical History:  Diagnosis Date  . Anxiety   . Asthma   . CAD S/P percutaneous coronary angioplasty August 2016   a. Anterior STEMI 08/2014 - DES PCI to p-mLAD CAD - ; chronic total occlusion of mRCA with R-R/L-R collaterals, moderate Cx disease. EF 50% with inferior/apical mild HK.  Marland Kitchen COPD (chronic obstructive pulmonary disease) (HCC)   . Degenerative arthritis of lumbar spine   . Dementia   . Depression   . Diabetes mellitus without complication (HCC)    Type II (non-insulin dependent)  . Essential hypertension   . High blood pressure   . High cholesterol   . Hyperglycemia, unspecified    No diagnosis of diabetes  . Ischemic cardiomyopathy    a. Mild - cath 08/2014 showing EF 50% with inferior/apical mild HK, EF 45-50% by echo.  . Mild aortic stenosis by prior echocardiogram    a. Mild AS (mean gradient 12 mmHg, AVA~1.07 cm^2)  . NSVT (nonsustained ventricular tachycardia) (HCC)    a. One run of NSVT on 09/12/14, possibly 2/2 reperfusion.  . ST elevation (STEMI) myocardial infarction involving left anterior descending coronary artery (HCC)  09/12/2014   CTO of RCA with new 100% p-mLAD   Past Surgical History:  Procedure Laterality Date  . APPENDECTOMY    . CARDIAC CATHETERIZATION N/A 09/12/2014   Procedure: Left Heart Cath and Coronary Angiography;  Surgeon: Lyn Records, MD;  Location: [facility] INVASIVE CV LAB; culprit = 100% pLAD --> PCI, mRCA CTO 100% (R-R & L-R collaterals), Mild-Mod (50%) dCx, Mod  65% RI. Inferior & apical HK - EF ~50%.  . CARDIAC CATHETERIZATION N/A 09/12/2014   Procedure: Coronary Stent Intervention;  Surgeon: Lyn Records, MD;  Location: [facility] INVASIVE CV LAB;  Service: Cardiovascular;  Promus Premier DES 3.0 mm x 20 mm p-mLAD  . CARDIAC SURGERY     pericardial window  . FRACTURE SURGERY    . TRANSTHORACIC ECHOCARDIOGRAM  09/14/2014    Mild LVH. EF 45-50% (mildly reduced). Hypokinesis of mid inferior wall. GR 1 DD with high filling pressures. Mild AMS (mean gradient 12 mmHg, AVA~1.07 cm^2). Mild MR. Mild RV dilation. Moderate RA dilation    reports that she quit smoking about 5 months ago. Her smoking use included Cigarettes. She has a 30.00 pack-year smoking history. She has never used smokeless tobacco. She reports that she does not drink alcohol or use drugs. Social History   Social History  . Marital status: Widowed    Spouse name: N/A  . Number of children: N/A  . Years of education: College   Occupational History  . Not  on file.   Social History Main Topics  . Smoking status: Former Smoker    Packs/day: 0.50    Years: 60.00    Types: Cigarettes    Quit date: 07/28/2015  . Smokeless tobacco: Never Used     Comment: a pack day all her life, will try to quit   . Alcohol use No  . Drug use: No  . Sexual activity: No   Other Topics Concern  . Not on file   Social History Narrative   The patient currently lives with her son. Unfortunately they lost their home, so they're now living in the rental home of her step-grandson (he is the son of a deceased son's ex-wife)   The step grandsons name is  Joanne Thompson telephone number [phone].    Functional Status Survey:    Family History  Problem Relation Age of Onset  . Liver disease Mother   . Stroke Father     Health Maintenance  Topic Date Due  . FOOT EXAM  10/27/1940  . OPHTHALMOLOGY EXAM  10/27/1940  . TETANUS/TDAP  10/27/1951  . ZOSTAVAX  10/26/1992  . DEXA SCAN  10/26/1997  . PNA vac Low Risk Adult (1 of 2 - PCV13) 10/26/1997  . HEMOGLOBIN A1C  04/27/2016  . INFLUENZA VACCINE  Completed    No Known Allergies  Allergies as of 01/24/2016   No Known Allergies     Medication List       Accurate as of 01/24/16 10:38 AM. Always use your most recent med list.          acetaminophen 325 MG tablet Commonly known as:  TYLENOL Take 650 mg by mouth every 6 (six) hours as needed for mild pain.   aspirin 81 MG EC tablet Take 1 tablet (81 mg total) by mouth daily.   atorvastatin 80 MG tablet Commonly known as:  LIPITOR TAKE 1 TABLET (80 MG TOTAL) BY MOUTH EVERY EVENING.   AZO BLADDER CONTROL/GO-LESS PO Take 1 capsule by mouth 3 times/day as needed-between meals & bedtime.   B-12 2500 MCG Tabs Take 2,500 mcg by mouth daily.   budesonide-formoterol 160-4.5 MCG/ACT inhaler Commonly known as:  SYMBICORT Inhale 2 puffs into the lungs 2 (two) times daily.   carvedilol 6.25 MG tablet Commonly known as:  COREG Take 1 tablet (6.25 mg total) by mouth 2 (two) times daily with a meal.   cholecalciferol 1000 units tablet Commonly known as:  VITAMIN D Take 2,000 Units by mouth daily.   donepezil 10 MG tablet Commonly known as:  ARICEPT Take 1 tablet (10 mg total) by mouth at bedtime.   FLUoxetine 20 MG tablet Commonly known as:  PROZAC Take 1 tablet (20 mg total) by mouth daily.   HYDROcodone-acetaminophen 5-325 MG tablet Commonly known as:  NORCO/VICODIN Take 1-2 tablets by mouth every 6 (six) hours as needed for moderate pain.   insulin aspart 100 UNIT/ML injection Commonly known as:  novoLOG Inject  0-9 Units into the skin 3 (three) times daily with meals. Sliding scale insulin Less than 70 initiate hypoglycemia protocol 70-120  0 units 120-150 1 unit 151-200 2 units 201-250 3 units 251-300 5 units 301-350 7 units 351-400 9 units  Greater than 400 call MD   loperamide 2 MG capsule Commonly known as:  IMODIUM Take 2 mg by mouth as needed for diarrhea or loose stools.   nitroGLYCERIN 0.4 MG SL tablet Commonly known as:  NITROSTAT Place 1 tablet (0.4 mg total) under the  tongue every 5 (five) minutes as needed for chest pain (up to 3 doses).   ramipril 2.5 MG capsule Commonly known as:  ALTACE Take 1 capsule (2.5 mg total) by mouth 2 (two) times daily. NEED OV.   ticagrelor 90 MG Tabs tablet Commonly known as:  BRILINTA Take 1 tablet (90 mg total) by mouth 2 (two) times daily.       Review of Systems  Vitals:   01/24/16 1029  BP: 132/70  Pulse: 94  Resp: 20  Temp: 97 F (36.1 C)  Weight: 99 lb (44.9 kg)  Height: 5\' 6"  (1.676 m)   Body mass index is 15.98 kg/m. Physical Exam  Labs reviewed: Basic Metabolic Panel:  Recent Labs  31/54/00 1157 01/16/16 1815 01/17/16 0428  NA 138 132* 133*  K 4.4 3.8 4.3  CL 103 101 101  CO2 30 24 24   GLUCOSE 145* 143* 151*  BUN 28* 37* 26*  CREATININE 0.78 0.80 0.61  CALCIUM 9.5 8.4* 8.3*   Liver Function Tests:  Recent Labs  06/29/15 1219 01/16/16 1815  AST 17 27  ALT 12 22  ALKPHOS 106 72  BILITOT 0.6 1.5*  PROT 7.4 7.5  ALBUMIN 3.6 3.2*   No results for input(s): LIPASE, AMYLASE in the last 8760 hours. No results for input(s): AMMONIA in the last 8760 hours. CBC:  Recent Labs  06/29/15 1219 01/16/16 1815 01/17/16 0428  WBC 7.0 10.5 11.6*  NEUTROABS 5.3 9.4*  --   HGB 11.1* 11.3* 11.6*  HCT 35.1* 33.9* 34.8*  MCV 80.9 81.3 81.3  PLT 295.0 242 246   Cardiac Enzymes: No results for input(s): CKTOTAL, CKMB, CKMBINDEX, TROPONINI in the last 8760 hours. BNP: Invalid input(s): POCBNP Lab Results    Component Value Date   HGBA1C 7.7 (H) 10/28/2015   Lab Results  Component Value Date   TSH 4.450 09/12/2014   Lab Results  Component Value Date   VITAMINB12 693 10/28/2015   No results found for: FOLATE No results found for: IRON, TIBC, FERRITIN  Imaging and Procedures obtained prior to SNF admission: Dg Chest 1 View  Result Date: 01/16/2016 CLINICAL DATA:  History of asthma and hypertension EXAM: CHEST 1 VIEW COMPARISON:  06/29/2015, CT chest 09/29/2014, radiograph chest 09/12/2014 FINDINGS: AP supine view chest. Oval mass in the apical portion of the left lung, corresponding to prior CT mass again demonstrated. The lungs are hyperinflated. No acute infiltrate or effusion. Right apical pleural thickening. Stable borderline to mild cardiomegaly. No pneumothorax. Stable sclerotic lesion mid right humerus. Old right-sided rib fractures. IMPRESSION: 1. Large oval mass/opacity in the apical portion of the left upper lobe as previously described. 2. Mild hyperinflation.  No acute infiltrate. Electronically Signed   By: Jasmine Pang M.D.   On: 01/16/2016 19:19   Ct Pelvis Wo Contrast  Result Date: 01/16/2016 CLINICAL DATA:  Fall.  Concern for RIGHT hip fracture. EXAM: CT PELVIS WITHOUT CONTRAST TECHNIQUE: Multidetector CT imaging of the pelvis was performed following the standard protocol without intravenous contrast. COMPARISON:  None. FINDINGS: Urinary Tract:  Unremarkable Bowel:  Unremarkable Vascular/Lymphatic: This calcification aorta and branches. No lymphadenopathy Reproductive:  Uterus and ovaries grossly normal as noncontrast exam Other:  No free-fluid in the pelvis Musculoskeletal: There is a complex fracture of the RIGHT acetabulum involving the posterior wall, anterior wall and medial wall. There is minimal displacement of the fracture fragments. There is small hematoma within the RIGHT operator muscle which measures 16 mm in thickness. No fracture of the  RIGHT femoral neck. Remote  fracture of the LEFT superior pubic ramus. IMPRESSION: 1. Complex minimally displaced fracture of the RIGHT acetabulum involving the anterior wall, posterior wall and medial wall. 2. Small hematoma in the RIGHT operator muscle. Electronically Signed   By: Genevive Bi M.D.   On: 01/16/2016 19:14   Dg Hip Unilat With Pelvis 2-3 Views Right  Result Date: 01/16/2016 CLINICAL DATA:  Status post fall today. EXAM: DG HIP (WITH OR WITHOUT PELVIS) 2-3V RIGHT COMPARISON:  None. FINDINGS: There is no evidence of hip fracture or dislocation. There is chronic deformity left superior and inferior pubic rami. Degenerative joint changes of bilateral hips and visualized lumbar spine are noted. IMPRESSION: No acute fracture or dislocation. Electronically Signed   By: Sherian Rein M.D.   On: 01/16/2016 17:36    Assessment/Plan There are no diagnoses linked to this encounter.   Family/ staff Communication:   Labs/tests ordered:

## 2016-01-24 NOTE — Progress Notes (Signed)
: Provider:  Merrilee Seashore  MD Location:  [facility] Room Number: 111 Place of Service:  SNF ([phone])  PCP: Merrilee Seashore, MD Patient Care Team: Margit Hanks, MD as PCP - General (Internal Medicine)  Extended Emergency Contact Information Primary Emergency Contact: Crocket,David Address: [address]          [place], [place] [address] [place] Home Phone: [phone] Relation: Son     Allergies: Patient has no known allergies.  Chief Complaint  Patient presents with  . New Admit To SNF    HPI: Patient is 83 y.o. female with asthma, HTN, HLD, dementia, depression, ischemic cardiomyopathy, NSVT, CAD s/p STEMI (8/16)presented to the emergency department for evaluation of hip pain s/p mechanical fall. Per records, patientwas in a usual state of health until this afternoon when she sustained a witnessed mechanical fall. Patient lives at home with a caregiver who indicated that she tripped and fell, landed on her right side, with no head trauma or LOC. Pt was admitted to [facility] from 12/31-1/3 where she was admitted for for eval and diagnosis of a R acetabular fracture and she will be managed conservatively with pain control and rehab. Hospital course was complicated by a UTI and hyperglycemia , new dx of DM2. Pt is admitted to SNF for Ot/PT. While at SNF pt will be followed for CAD, tx with ASA, statin, brilinta, nitro, HTN, tx with coreg and ramipril and dementia, tx with aricept.  Past Medical History:  Diagnosis Date  . Anxiety   . Asthma   . CAD S/P percutaneous coronary angioplasty August 2016   a. Anterior STEMI 08/2014 - DES PCI to p-mLAD CAD - ; chronic total occlusion of mRCA with R-R/L-R collaterals, moderate Cx disease. EF 50% with inferior/apical mild HK.  Marland Kitchen COPD (chronic obstructive pulmonary disease) (HCC)   . Degenerative arthritis of lumbar spine   . Dementia   . Depression   . Diabetes mellitus without complication (HCC)      Type II (non-insulin dependent)  . Essential hypertension   . High blood pressure   . High cholesterol   . Hyperglycemia, unspecified    No diagnosis of diabetes  . Ischemic cardiomyopathy    a. Mild - cath 08/2014 showing EF 50% with inferior/apical mild HK, EF 45-50% by echo.  . Mild aortic stenosis by prior echocardiogram    a. Mild AS (mean gradient 12 mmHg, AVA~1.07 cm^2)  . NSVT (nonsustained ventricular tachycardia) (HCC)    a. One run of NSVT on 09/12/14, possibly 2/2 reperfusion.  . ST elevation (STEMI) myocardial infarction involving left anterior descending coronary artery (HCC) 09/12/2014   CTO of RCA with new 100% p-mLAD    Past Surgical History:  Procedure Laterality Date  . APPENDECTOMY    . CARDIAC CATHETERIZATION N/A 09/12/2014   Procedure: Left Heart Cath and Coronary Angiography;  Surgeon: Lyn Records, MD;  Location: [facility] INVASIVE CV LAB; culprit = 100% pLAD --> PCI, mRCA CTO 100% (R-R & L-R collaterals), Mild-Mod (50%) dCx, Mod  65% RI. Inferior & apical HK - EF ~50%.  . CARDIAC CATHETERIZATION N/A 09/12/2014   Procedure: Coronary Stent Intervention;  Surgeon: Lyn Records, MD;  Location: [facility] INVASIVE CV LAB;  Service: Cardiovascular;  Promus Premier DES 3.0 mm x 20 mm p-mLAD  . CARDIAC SURGERY     pericardial window  . FRACTURE SURGERY    . TRANSTHORACIC ECHOCARDIOGRAM  09/14/2014    Mild LVH.  EF 45-50% (mildly reduced). Hypokinesis of mid inferior wall. GR 1 DD with high filling pressures. Mild AMS (mean gradient 12 mmHg, AVA~1.07 cm^2). Mild MR. Mild RV dilation. Moderate RA dilation    Allergies as of 01/24/2016   No Known Allergies     Medication List       Accurate as of 01/24/16 12:19 PM. Always use your most recent med list.          acetaminophen 325 MG tablet Commonly known as:  TYLENOL Take 650 mg by mouth every 6 (six) hours as needed for mild pain.   aspirin 81 MG EC tablet Take 1 tablet (81 mg total) by mouth daily.   atorvastatin 80 MG  tablet Commonly known as:  LIPITOR TAKE 1 TABLET (80 MG TOTAL) BY MOUTH EVERY EVENING.   AZO BLADDER CONTROL/GO-LESS PO Take 1 capsule by mouth 3 times/day as needed-between meals & bedtime.   B-12 2500 MCG Tabs Take 2,500 mcg by mouth daily.   budesonide-formoterol 160-4.5 MCG/ACT inhaler Commonly known as:  SYMBICORT Inhale 2 puffs into the lungs 2 (two) times daily.   carvedilol 6.25 MG tablet Commonly known as:  COREG Take 1 tablet (6.25 mg total) by mouth 2 (two) times daily with a meal.   cholecalciferol 1000 units tablet Commonly known as:  VITAMIN D Take 2,000 Units by mouth daily.   donepezil 10 MG tablet Commonly known as:  ARICEPT Take 1 tablet (10 mg total) by mouth at bedtime.   FLUoxetine 20 MG tablet Commonly known as:  PROZAC Take 1 tablet (20 mg total) by mouth daily.   HYDROcodone-acetaminophen 5-325 MG tablet Commonly known as:  NORCO/VICODIN Take 1-2 tablets by mouth every 6 (six) hours as needed for moderate pain.   insulin aspart 100 UNIT/ML injection Commonly known as:  novoLOG Inject 0-9 Units into the skin 3 (three) times daily with meals. Sliding scale insulin Less than 70 initiate hypoglycemia protocol 70-120  0 units 120-150 1 unit 151-200 2 units 201-250 3 units 251-300 5 units 301-350 7 units 351-400 9 units  Greater than 400 call MD   loperamide 2 MG capsule Commonly known as:  IMODIUM Take 2 mg by mouth as needed for diarrhea or loose stools.   nitroGLYCERIN 0.4 MG SL tablet Commonly known as:  NITROSTAT Place 1 tablet (0.4 mg total) under the tongue every 5 (five) minutes as needed for chest pain (up to 3 doses).   ramipril 2.5 MG capsule Commonly known as:  ALTACE Take 1 capsule (2.5 mg total) by mouth 2 (two) times daily. NEED OV.   ticagrelor 90 MG Tabs tablet Commonly known as:  BRILINTA Take 1 tablet (90 mg total) by mouth 2 (two) times daily.       No orders of the defined types were placed in this  encounter.   Immunization History  Administered Date(s) Administered  . Influenza, High Dose Seasonal PF 10/28/2015  . Influenza,inj,Quad PF,36+ Mos 09/13/2014    Social History  Substance Use Topics  . Smoking status: Former Smoker    Packs/day: 0.50    Years: 60.00    Types: Cigarettes    Quit date: 07/28/2015  . Smokeless tobacco: Never Used     Comment: a pack day all her life, will try to quit   . Alcohol use No    Family history is   Family History  Problem Relation Age of Onset  . Liver disease Mother   . Stroke Father  Review of Systems  DATA OBTAINED: from patient,  family member GENERAL:  no fevers, fatigue, appetite changes SKIN: No itching, or rash EYES: No eye pain, redness, discharge EARS: No earache, tinnitus, change in hearing NOSE: No congestion, drainage or bleeding  MOUTH/THROAT: No mouth or tooth pain, No sore throat RESPIRATORY: No cough, wheezing, SOB CARDIAC: No chest pain, palpitations, lower extremity edema  GI: No abdominal pain, No N/V/D or constipation, No heartburn or reflux  GU: No dysuria, frequency or urgency, or incontinence  MUSCULOSKELETAL: No unrelieved bone/joint pain NEUROLOGIC: No headache, dizziness or focal weakness PSYCHIATRIC: No c/o anxiety or sadness   Vitals:   01/24/16 1029  BP: 132/70  Pulse: 94  Resp: 20  Temp: 97 F (36.1 C)    SpO2 Readings from Last 1 Encounters:  01/19/16 96%   Body mass index is 15.98 kg/m.     Physical Exam  GENERAL APPEARANCE: Alert, conversant,  No acute distress.  SKIN: No diaphoresis rash HEAD: Normocephalic, atraumatic  EYES: Conjunctiva/lids clear. Pupils round, reactive. EOMs intact.  EARS: External exam WNL, canals clear. Hearing grossly normal.  NOSE: No deformity or discharge.  MOUTH/THROAT: Lips w/o lesions  RESPIRATORY: Breathing is even, unlabored. Lung sounds are clear   CARDIOVASCULAR: Heart RRR 4/6 systolic murmur, no rubs or gallops. No peripheral  edema.   GASTROINTESTINAL: Abdomen is soft, non-tender, not distended w/ normal bowel sounds. GENITOURINARY: Bladder non tender, not distended  MUSCULOSKELETAL: No abnormal joints or musculature NEUROLOGIC:  Cranial nerves 2-12 grossly intact. Moves all extremities  PSYCHIATRIC: Mood and affect appropriate to situation with dementia, no behavioral issues  Patient Active Problem List   Diagnosis Date Noted  . Closed nondisplaced fracture of anterior wall of right acetabulum (HCC)   . Closed right acetabular fracture (HCC) 01/16/2016  . Anemia, B12 deficiency 10/28/2015  . COPD with exacerbation (HCC) 10/28/2015  . Diabetes mellitus type II, non insulin dependent (HCC) 07/30/2015  . Anxiety disorder 07/30/2015  . CAD S/P percutaneous coronary angioplasty 09/14/2014  . Essential hypertension 09/14/2014  . Ischemic cardiomyopathy, mild 09/14/2014  . Hyperglycemia 09/14/2014  . NSVT (nonsustained ventricular tachycardia) (HCC) 09/14/2014  . Moderate dementia with behavioral disturbance   . Aortic stenosis   . ST elevation (STEMI) myocardial infarction involving left anterior descending coronary artery (HCC) 09/12/2014  . Mild aortic stenosis by prior echocardiogram 08/17/2014      Labs reviewed: Basic Metabolic Panel:    Component Value Date/Time   NA 133 (L) 01/17/2016 0428   K 4.3 01/17/2016 0428   CL 101 01/17/2016 0428   CO2 24 01/17/2016 0428   GLUCOSE 151 (H) 01/17/2016 0428   BUN 26 (H) 01/17/2016 0428   CREATININE 0.61 01/17/2016 0428   CALCIUM 8.3 (L) 01/17/2016 0428   PROT 7.5 01/16/2016 1815   ALBUMIN 3.2 (L) 01/16/2016 1815   AST 27 01/16/2016 1815   ALT 22 01/16/2016 1815   ALKPHOS 72 01/16/2016 1815   BILITOT 1.5 (H) 01/16/2016 1815   GFRNONAA >60 01/17/2016 0428   GFRAA >60 01/17/2016 0428     Recent Labs  10/28/15 1157 01/16/16 1815 01/17/16 0428  NA 138 132* 133*  K 4.4 3.8 4.3  CL 103 101 101  CO2 30 24 24   GLUCOSE 145* 143* 151*  BUN 28* 37*  26*  CREATININE 0.78 0.80 0.61  CALCIUM 9.5 8.4* 8.3*   Liver Function Tests:  Recent Labs  06/29/15 1219 01/16/16 1815  AST 17 27  ALT 12 22  ALKPHOS 106  72  BILITOT 0.6 1.5*  PROT 7.4 7.5  ALBUMIN 3.6 3.2*   No results for input(s): LIPASE, AMYLASE in the last 8760 hours. No results for input(s): AMMONIA in the last 8760 hours. CBC:  Recent Labs  06/29/15 1219 01/16/16 1815 01/17/16 0428  WBC 7.0 10.5 11.6*  NEUTROABS 5.3 9.4*  --   HGB 11.1* 11.3* 11.6*  HCT 35.1* 33.9* 34.8*  MCV 80.9 81.3 81.3  PLT 295.0 242 246   Lipid  Recent Labs  10/28/15 1157  CHOL 166  HDL 44.80  LDLCALC 104*  TRIG 87.0    Cardiac Enzymes: No results for input(s): CKTOTAL, CKMB, CKMBINDEX, TROPONINI in the last 8760 hours. BNP: No results for input(s): BNP in the last 8760 hours. Lab Results  Component Value Date   MICROALBUR 9.1 (H) 10/28/2015   Lab Results  Component Value Date   HGBA1C 7.7 (H) 10/28/2015   Lab Results  Component Value Date   TSH 4.450 09/12/2014   Lab Results  Component Value Date   VITAMINB12 693 10/28/2015   No results found for: FOLATE No results found for: IRON, TIBC, FERRITIN  Imaging and Procedures obtained prior to SNF admission: Dg Chest 1 View  Result Date: 01/16/2016 CLINICAL DATA:  History of asthma and hypertension EXAM: CHEST 1 VIEW COMPARISON:  06/29/2015, CT chest 09/29/2014, radiograph chest 09/12/2014 FINDINGS: AP supine view chest. Oval mass in the apical portion of the left lung, corresponding to prior CT mass again demonstrated. The lungs are hyperinflated. No acute infiltrate or effusion. Right apical pleural thickening. Stable borderline to mild cardiomegaly. No pneumothorax. Stable sclerotic lesion mid right humerus. Old right-sided rib fractures. IMPRESSION: 1. Large oval mass/opacity in the apical portion of the left upper lobe as previously described. 2. Mild hyperinflation.  No acute infiltrate. Electronically Signed   By:  Jasmine Pang M.D.   On: 01/16/2016 19:19   Ct Pelvis Wo Contrast  Result Date: 01/16/2016 CLINICAL DATA:  Fall.  Concern for RIGHT hip fracture. EXAM: CT PELVIS WITHOUT CONTRAST TECHNIQUE: Multidetector CT imaging of the pelvis was performed following the standard protocol without intravenous contrast. COMPARISON:  None. FINDINGS: Urinary Tract:  Unremarkable Bowel:  Unremarkable Vascular/Lymphatic: This calcification aorta and branches. No lymphadenopathy Reproductive:  Uterus and ovaries grossly normal as noncontrast exam Other:  No free-fluid in the pelvis Musculoskeletal: There is a complex fracture of the RIGHT acetabulum involving the posterior wall, anterior wall and medial wall. There is minimal displacement of the fracture fragments. There is small hematoma within the RIGHT operator muscle which measures 16 mm in thickness. No fracture of the RIGHT femoral neck. Remote fracture of the LEFT superior pubic ramus. IMPRESSION: 1. Complex minimally displaced fracture of the RIGHT acetabulum involving the anterior wall, posterior wall and medial wall. 2. Small hematoma in the RIGHT operator muscle. Electronically Signed   By: Genevive Bi M.D.   On: 01/16/2016 19:14   Dg Hip Unilat With Pelvis 2-3 Views Right  Result Date: 01/16/2016 CLINICAL DATA:  Status post fall today. EXAM: DG HIP (WITH OR WITHOUT PELVIS) 2-3V RIGHT COMPARISON:  None. FINDINGS: There is no evidence of hip fracture or dislocation. There is chronic deformity left superior and inferior pubic rami. Degenerative joint changes of bilateral hips and visualized lumbar spine are noted. IMPRESSION: No acute fracture or dislocation. Electronically Signed   By: Sherian Rein M.D.   On: 01/16/2016 17:36     Not all labs, radiology exams or other studies done during hospitalization come  through on my EPIC note; however they are reviewed by me.    Assessment and Plan  CLOSED R ACETABULAR FRACTURE - NWB for 8-12 weeks; ASA and  brilinta asprophylaxis SNF - admitted for OT/PT; cont ASA and brilinta as prophylaxis  UTI - treated with rocephin in hospital SNF - keflex for 3 more days until 1/6  DM2 - new onset;hyperglycemia in hosp, A1c 7.7 SNF - pt d/c  On SSI, not really good for geriatric pt; Pt's cr os good, will start glucophage 500 mg BID  CAD SNF - cont ASA 81 mg, brilinta 90 mg BIDm prn nitro and statin, coreg 6.25 mg BID  HTN SNF - cont coreg 6,25 mg BID, altace 2.5 mg BID  DEMENTIA WITH BEHAVOIR SNF - cont aricept 10 mg qHS  ASTHMA  SNF - stable;cont symbicort  ANEMIA SNF - d/c Hb 11.6; will f/u CBC    No problem-specific Assessment & Plan notes found for this encounter.  Time spent > 45 min;> 50% of time with patient was spent reviewing records, labs, tests and studies, counseling and developing plan of care  Merrilee Seashore , MD

## 2016-02-08 ENCOUNTER — Ambulatory Visit (INDEPENDENT_AMBULATORY_CARE_PROVIDER_SITE_OTHER): Payer: Medicare Other

## 2016-02-08 ENCOUNTER — Ambulatory Visit (INDEPENDENT_AMBULATORY_CARE_PROVIDER_SITE_OTHER): Payer: Medicare Other | Admitting: Orthopaedic Surgery

## 2016-02-08 DIAGNOSIS — S32414D Nondisplaced fracture of anterior wall of right acetabulum, subsequent encounter for fracture with routine healing: Secondary | ICD-10-CM | POA: Diagnosis not present

## 2016-02-08 NOTE — Progress Notes (Signed)
The patient is a 83 year old female with profound dementia. She is seen in follow-up of her for from the hospital for a nondisplaced right pelvic acetabular fracture. She's been in a wheelchair and nonweightbearing. In the office today she is alert but not oriented she follows only limited commands and is pleasantly platelet demented.  Examination of her right hip elicits minimal pain with rotation of the hip. An x-ray of the pelvis shows the right hip is well located. The acetabular fracture remains well aligned with mild interval healing.  At this point she will remain nonweightbearing for another 6-8 weeks and at that point and have her activity advanced as tolerated. She'll follow-up as needed.

## 2016-02-16 ENCOUNTER — Non-Acute Institutional Stay (SKILLED_NURSING_FACILITY): Payer: Medicare Other | Admitting: Internal Medicine

## 2016-02-16 DIAGNOSIS — Z20828 Contact with and (suspected) exposure to other viral communicable diseases: Secondary | ICD-10-CM

## 2016-02-17 ENCOUNTER — Encounter: Payer: Self-pay | Admitting: Internal Medicine

## 2016-02-17 ENCOUNTER — Non-Acute Institutional Stay (SKILLED_NURSING_FACILITY): Payer: Medicare Other | Admitting: Internal Medicine

## 2016-02-17 DIAGNOSIS — E119 Type 2 diabetes mellitus without complications: Secondary | ICD-10-CM

## 2016-02-17 DIAGNOSIS — I110 Hypertensive heart disease with heart failure: Secondary | ICD-10-CM | POA: Diagnosis not present

## 2016-02-17 DIAGNOSIS — I2589 Other forms of chronic ischemic heart disease: Secondary | ICD-10-CM | POA: Diagnosis not present

## 2016-02-17 DIAGNOSIS — I255 Ischemic cardiomyopathy: Secondary | ICD-10-CM

## 2016-02-17 NOTE — Progress Notes (Signed)
Location:  [facility] Room Number: 211D Place of Service:  SNF [phone])  Merrilee Seashore, MD  Patient Care Team: Margit Hanks, MD as PCP - General (Internal Medicine)  Extended Emergency Contact Information Primary Emergency Contact: Welles,David Address: [address]          [place], [place] [address] [place] of [place] Home Phone: [phone] Relation: Son    Allergies: Patient has no known allergies.  Chief Complaint  Patient presents with  . Medical Management of Chronic Issues    Routine Visit    HPI: Patient is 83 y.o. female who is being seen for routine issues of, HTN, cardiomyopathy and DM2.  Past Medical History:  Diagnosis Date  . Anxiety   . Asthma   . CAD S/P percutaneous coronary angioplasty August 2016   a. Anterior STEMI 08/2014 - DES PCI to p-mLAD CAD - ; chronic total occlusion of mRCA with R-R/L-R collaterals, moderate Cx disease. EF 50% with inferior/apical mild HK.  Marland Kitchen COPD (chronic obstructive pulmonary disease) (HCC)   . Degenerative arthritis of lumbar spine   . Dementia   . Depression   . Diabetes mellitus without complication (HCC)    Type II (non-insulin dependent)  . Essential hypertension   . High blood pressure   . High cholesterol   . Hyperglycemia, unspecified    No diagnosis of diabetes  . Ischemic cardiomyopathy    a. Mild - cath 08/2014 showing EF 50% with inferior/apical mild HK, EF 45-50% by echo.  . Mild aortic stenosis by prior echocardiogram    a. Mild AS (mean gradient 12 mmHg, AVA~1.07 cm^2)  . NSVT (nonsustained ventricular tachycardia) (HCC)    a. One run of NSVT on 09/12/14, possibly 2/2 reperfusion.  . ST elevation (STEMI) myocardial infarction involving left anterior descending coronary artery (HCC) 09/12/2014   CTO of RCA with new 100% p-mLAD    Past Surgical History:  Procedure Laterality Date  . APPENDECTOMY    . CARDIAC CATHETERIZATION N/A 09/12/2014   Procedure: Left Heart Cath  and Coronary Angiography;  Surgeon: Lyn Records, MD;  Location: [facility] INVASIVE CV LAB; culprit = 100% pLAD --> PCI, mRCA CTO 100% (R-R & L-R collaterals), Mild-Mod (50%) dCx, Mod  65% RI. Inferior & apical HK - EF ~50%.  . CARDIAC CATHETERIZATION N/A 09/12/2014   Procedure: Coronary Stent Intervention;  Surgeon: Lyn Records, MD;  Location: [facility] INVASIVE CV LAB;  Service: Cardiovascular;  Promus Premier DES 3.0 mm x 20 mm p-mLAD  . CARDIAC SURGERY     pericardial window  . FRACTURE SURGERY    . TRANSTHORACIC ECHOCARDIOGRAM  09/14/2014    Mild LVH. EF 45-50% (mildly reduced). Hypokinesis of mid inferior wall. GR 1 DD with high filling pressures. Mild AMS (mean gradient 12 mmHg, AVA~1.07 cm^2). Mild MR. Mild RV dilation. Moderate RA dilation    Allergies as of 02/17/2016   No Known Allergies     Medication List       Accurate as of 02/17/16 11:59 PM. Always use your most recent med list.          acetaminophen 325 MG tablet Commonly known as:  TYLENOL Take 650 mg by mouth every 6 (six) hours as needed for mild pain.   aspirin 81 MG EC tablet Take 1 tablet (81 mg total) by mouth daily.   atorvastatin 80 MG tablet Commonly known as:  LIPITOR TAKE 1 TABLET (80 MG TOTAL) BY MOUTH EVERY EVENING.  AZO BLADDER CONTROL/GO-LESS PO Take 1 capsule by mouth 3 times/day as needed-between meals & bedtime.   B-12 2500 MCG Tabs Take 2,500 mcg by mouth daily.   budesonide-formoterol 160-4.5 MCG/ACT inhaler Commonly known as:  SYMBICORT Inhale 2 puffs into the lungs 2 (two) times daily.   carvedilol 6.25 MG tablet Commonly known as:  COREG Take 1 tablet (6.25 mg total) by mouth 2 (two) times daily with a meal.   cholecalciferol 400 units Tabs tablet Commonly known as:  VITAMIN D Take 200 Units by mouth daily. 1/2 tab   donepezil 10 MG tablet Commonly known as:  ARICEPT Take 1 tablet (10 mg total) by mouth at bedtime.   ENSURE Take 237 mLs by mouth 2 (two) times daily between meals.     FLUoxetine 20 MG tablet Commonly known as:  PROZAC Take 1 tablet (20 mg total) by mouth daily.   HYDROcodone-acetaminophen 5-325 MG tablet Commonly known as:  NORCO/VICODIN Take 1-2 tablets by mouth every 6 (six) hours as needed for moderate pain. Give 1 tablet for mild pain and 2 tablets for moderate pain.   loperamide 2 MG capsule Commonly known as:  IMODIUM Take 2 mg by mouth as needed for diarrhea or loose stools. After each loose stool. Do not take more than 8 mg in 24 hours.   metFORMIN 500 MG tablet Commonly known as:  GLUCOPHAGE Take 500 mg by mouth 2 (two) times daily with a meal.   nitroGLYCERIN 0.4 MG SL tablet Commonly known as:  NITROSTAT Place 1 tablet (0.4 mg total) under the tongue every 5 (five) minutes as needed for chest pain (up to 3 doses).   ramipril 2.5 MG capsule Commonly known as:  ALTACE Take 1 capsule (2.5 mg total) by mouth 2 (two) times daily. NEED OV.   ticagrelor 90 MG Tabs tablet Commonly known as:  BRILINTA Take 1 tablet (90 mg total) by mouth 2 (two) times daily.       Meds ordered this encounter  Medications  . cholecalciferol (VITAMIN D) 400 units TABS tablet    Sig: Take 200 Units by mouth daily. 1/2 tab    Immunization History  Administered Date(s) Administered  . Influenza, High Dose Seasonal PF 10/28/2015  . Influenza,inj,Quad PF,36+ Mos 09/13/2014  . PPD Test 01/24/2015    Social History  Substance Use Topics  . Smoking status: Former Smoker    Packs/day: 0.50    Years: 60.00    Types: Cigarettes    Quit date: 07/28/2015  . Smokeless tobacco: Never Used     Comment: a pack day all her life, will try to quit   . Alcohol use No    Review of Systems  DATA OBTAINED: from patient, nurse GENERAL:  no fevers, fatigue, appetite changes SKIN: No itching, rash HEENT: No complaint RESPIRATORY: No cough, wheezing, SOB CARDIAC: No chest pain, palpitations, lower extremity edema  GI: No abdominal pain, No N/V/D or  constipation, No heartburn or reflux  GU: No dysuria, frequency or urgency, or incontinence  MUSCULOSKELETAL: No unrelieved bone/joint pain NEUROLOGIC: No headache, dizziness  PSYCHIATRIC: No overt anxiety or sadness  Vitals:   02/17/16 1140  BP: 119/75  Pulse: 69  Resp: 17  Temp: 97.3 F (36.3 C)   Body mass index is 15.37 kg/m. Physical Exam  GENERAL APPEARANCE: Alert, conversant, No acute distress  SKIN: No diaphoresis rash; old bruising on extremities HEENT: Unremarkable RESPIRATORY: Breathing is even, unlabored. Lung sounds are clear   CARDIOVASCULAR: Heart RRR  4/6 systolic murmur, no rubs or gallops. No peripheral edema  GASTROINTESTINAL: Abdomen is soft, non-tender, not distended w/ normal bowel sounds.  GENITOURINARY: Bladder non tender, not distended  MUSCULOSKELETAL: No abnormal joints or musculature NEUROLOGIC: Cranial nerves 2-12 grossly intact. Moves all extremities PSYCHIATRIC: Mood and affect appropriate to situation with dementia, no behavioral issues  Patient Active Problem List   Diagnosis Date Noted  . UTI (urinary tract infection) 01/24/2016  . Anxiety 01/24/2016  . Asthma 01/24/2016  . Closed nondisplaced fracture of anterior wall of right acetabulum (HCC)   . Closed right acetabular fracture (HCC) 01/16/2016  . Anemia, B12 deficiency 10/28/2015  . COPD with exacerbation (HCC) 10/28/2015  . Diabetes mellitus type II, non insulin dependent (HCC) 07/30/2015  . Anxiety disorder 07/30/2015  . CAD S/P percutaneous coronary angioplasty 09/14/2014  . Hypertensive heart disease with heart failure (HCC) 09/14/2014  . Ischemic cardiomyopathy, mild 09/14/2014  . Hyperglycemia 09/14/2014  . NSVT (nonsustained ventricular tachycardia) (HCC) 09/14/2014  . Dementia   . Aortic stenosis   . ST elevation (STEMI) myocardial infarction involving left anterior descending coronary artery (HCC) 09/12/2014  . Mild aortic stenosis by prior echocardiogram 08/17/2014     CMP     Component Value Date/Time   NA 133 (L) 01/17/2016 0428   NA 133 (A) 01/17/2016   K 4.3 01/17/2016 0428   CL 101 01/17/2016 0428   CO2 24 01/17/2016 0428   GLUCOSE 151 (H) 01/17/2016 0428   BUN 26 (H) 01/17/2016 0428   BUN 26 (A) 01/17/2016   CREATININE 0.61 01/17/2016 0428   CALCIUM 8.3 (L) 01/17/2016 0428   PROT 7.5 01/16/2016 1815   ALBUMIN 3.2 (L) 01/16/2016 1815   AST 27 01/16/2016 1815   ALT 22 01/16/2016 1815   ALKPHOS 72 01/16/2016 1815   BILITOT 1.5 (H) 01/16/2016 1815   GFRNONAA >60 01/17/2016 0428   GFRAA >60 01/17/2016 0428    Recent Labs  10/28/15 1157  01/16/16 1815 01/17/16 01/17/16 0428  NA 138  < > 132* 133* 133*  K 4.4  < > 3.8 4.3 4.3  CL 103  --  101  --  101  CO2 30  --  24  --  24  GLUCOSE 145*  --  143*  --  151*  BUN 28*  < > 37* 26* 26*  CREATININE 0.78  < > 0.80 0.6 0.61  CALCIUM 9.5  --  8.4*  --  8.3*  < > = values in this interval not displayed.  Recent Labs  06/29/15 1219 01/16/16 1815  AST 17 27  ALT 12 22  ALKPHOS 106 72  BILITOT 0.6 1.5*  PROT 7.4 7.5  ALBUMIN 3.6 3.2*    Recent Labs  06/29/15 1219  01/16/16 1815 01/17/16 01/17/16 0428  WBC 7.0  < > 10.5 10.5 11.6*  NEUTROABS 5.3  --  9.4*  --   --   HGB 11.1*  < > 11.3* 11.3* 11.6*  HCT 35.1*  < > 33.9* 34* 34.8*  MCV 80.9  --  81.3  --  81.3  PLT 295.0  < > 242 242 246  < > = values in this interval not displayed.  Recent Labs  10/28/15 1157  CHOL 166  LDLCALC 104*  TRIG 87.0   Lab Results  Component Value Date   MICROALBUR 9.1 (H) 10/28/2015   Lab Results  Component Value Date   TSH 4.450 09/12/2014   Lab Results  Component Value Date   HGBA1C  7.7 (H) 10/28/2015   Lab Results  Component Value Date   CHOL 166 10/28/2015   HDL 44.80 10/28/2015   LDLCALC 104 (H) 10/28/2015   TRIG 87.0 10/28/2015   CHOLHDL 4 10/28/2015    Significant Diagnostic Results in last 30 days:  Xr Pelvis 1-2 Views  Result Date: 02/15/2016 xxx     Assessment and Plan  Hypertensive heart disease with heart failure (HCC) Controlled; cont Altace 2.5 mg BID and coreg 6.25 mg BID  Ischemic cardiomyopathy, mild Controlled on no diuretics; pla to cont coreg 6.25 mg BID and altace 2.5 mg BID  Diabetes mellitus type II, non insulin dependent (HCC) Last A1c was 7.7, down from prior;reasonablre control for 83 yo; plan to cont metformin 500 ng BID; pt is on ACE and statin    Randon Goldsmith. Lyn Hollingshead, MD

## 2016-02-28 ENCOUNTER — Ambulatory Visit: Payer: Medicare Other | Admitting: Family Medicine

## 2016-03-06 ENCOUNTER — Encounter: Payer: Self-pay | Admitting: Internal Medicine

## 2016-03-06 ENCOUNTER — Non-Acute Institutional Stay (SKILLED_NURSING_FACILITY): Payer: Medicare Other | Admitting: Internal Medicine

## 2016-03-16 DIAGNOSIS — 419620001 Death: Secondary | SNOMED CT

## 2016-03-16 NOTE — Progress Notes (Signed)
Opened in error; Disregard.

## 2016-03-16 NOTE — Assessment & Plan Note (Signed)
Controlled on no diuretics; pla to cont coreg 6.25 mg BID and altace 2.5 mg BID

## 2016-03-16 NOTE — Assessment & Plan Note (Signed)
Last A1c was 7.7, down from prior;reasonablre control for 83 yo; plan to cont metformin 500 ng BID; pt is on ACE and statin

## 2016-03-16 NOTE — Progress Notes (Signed)
Location:  [facility] Room Number: 211D Place of Service:  SNF [phone])  Merrilee Seashore, MD  Patient Care Team: Margit Hanks, MD as PCP - General (Internal Medicine)  Extended Emergency Contact Information Primary Emergency Contact: Reddick,David Address: [address]          [place], [place] [address] [place] of [place] Home Phone: [phone] Relation: Son    Allergies: Patient has no known allergies.  Chief Complaint  Patient presents with  . Acute Visit    Acute    HPI: Patient is 83 y.o. female who I first became aware of from a phone call from SNF. Pt had died in her bed. She had eaten breakfast, was sitting in her WC in the hall across from the nursing station when it was noted that her color looked bad. She was taken to her room and put to bed where she drew her last breath. Shortly after I arrived at SNF to see her.  Past Medical History:  Diagnosis Date  . Anxiety   . Asthma   . CAD S/P percutaneous coronary angioplasty August 2016   a. Anterior STEMI 08/2014 - DES PCI to p-mLAD CAD - ; chronic total occlusion of mRCA with R-R/L-R collaterals, moderate Cx disease. EF 50% with inferior/apical mild HK.  Marland Kitchen COPD (chronic obstructive pulmonary disease) (HCC)   . Degenerative arthritis of lumbar spine   . Dementia   . Depression   . Diabetes mellitus without complication (HCC)    Type II (non-insulin dependent)  . Essential hypertension   . High blood pressure   . High cholesterol   . Hyperglycemia, unspecified    No diagnosis of diabetes  . Ischemic cardiomyopathy    a. Mild - cath 08/2014 showing EF 50% with inferior/apical mild HK, EF 45-50% by echo.  . Mild aortic stenosis by prior echocardiogram    a. Mild AS (mean gradient 12 mmHg, AVA~1.07 cm^2)  . NSVT (nonsustained ventricular tachycardia) (HCC)    a. One run of NSVT on 09/12/14, possibly 2/2 reperfusion.  . ST elevation (STEMI) myocardial infarction involving left  anterior descending coronary artery (HCC) 09/12/2014   CTO of RCA with new 100% p-mLAD    Past Surgical History:  Procedure Laterality Date  . APPENDECTOMY    . CARDIAC CATHETERIZATION N/A 09/12/2014   Procedure: Left Heart Cath and Coronary Angiography;  Surgeon: Lyn Records, MD;  Location: [facility] INVASIVE CV LAB; culprit = 100% pLAD --> PCI, mRCA CTO 100% (R-R & L-R collaterals), Mild-Mod (50%) dCx, Mod  65% RI. Inferior & apical HK - EF ~50%.  . CARDIAC CATHETERIZATION N/A 09/12/2014   Procedure: Coronary Stent Intervention;  Surgeon: Lyn Records, MD;  Location: [facility] INVASIVE CV LAB;  Service: Cardiovascular;  Promus Premier DES 3.0 mm x 20 mm p-mLAD  . CARDIAC SURGERY     pericardial window  . FRACTURE SURGERY    . TRANSTHORACIC ECHOCARDIOGRAM  09/14/2014    Mild LVH. EF 45-50% (mildly reduced). Hypokinesis of mid inferior wall. GR 1 DD with high filling pressures. Mild AMS (mean gradient 12 mmHg, AVA~1.07 cm^2). Mild MR. Mild RV dilation. Moderate RA dilation    Allergies as of Apr 02, 2016   No Known Allergies     Medication List       Accurate as of 2016/04/02 11:02 AM. Always use your most recent med list.          acetaminophen 325 MG tablet Commonly known as:  TYLENOL Take 650 mg by mouth every 6 (six) hours as needed for mild pain.   aspirin 81 MG EC tablet Take 1 tablet (81 mg total) by mouth daily.   atorvastatin 80 MG tablet Commonly known as:  LIPITOR TAKE 1 TABLET (80 MG TOTAL) BY MOUTH EVERY EVENING.   AZO BLADDER CONTROL/GO-LESS PO Take 1 capsule by mouth 3 times/day as needed-between meals & bedtime.   B-12 2500 MCG Tabs Take 2,500 mcg by mouth daily.   budesonide-formoterol 160-4.5 MCG/ACT inhaler Commonly known as:  SYMBICORT Inhale 2 puffs into the lungs 2 (two) times daily.   carvedilol 6.25 MG tablet Commonly known as:  COREG Take 1 tablet (6.25 mg total) by mouth 2 (two) times daily with a meal.   cholecalciferol 400 units Tabs tablet Commonly  known as:  VITAMIN D Take 200 Units by mouth daily. 1/2 tab   donepezil 10 MG tablet Commonly known as:  ARICEPT Take 1 tablet (10 mg total) by mouth at bedtime.   ENSURE Take 237 mLs by mouth 2 (two) times daily between meals.   FLUoxetine 20 MG tablet Commonly known as:  PROZAC Take 1 tablet (20 mg total) by mouth daily.   HYDROcodone-acetaminophen 5-325 MG tablet Commonly known as:  NORCO/VICODIN Take 1-2 tablets by mouth every 6 (six) hours as needed for moderate pain. Give 1 tablet for mild pain and 2 tablets for moderate pain.   loperamide 2 MG capsule Commonly known as:  IMODIUM Take 2 mg by mouth as needed for diarrhea or loose stools. After each loose stool. Do not take more than 8 mg in 24 hours.   metFORMIN 500 MG tablet Commonly known as:  GLUCOPHAGE Take 500 mg by mouth 2 (two) times daily with a meal.   nitroGLYCERIN 0.4 MG SL tablet Commonly known as:  NITROSTAT Place 1 tablet (0.4 mg total) under the tongue every 5 (five) minutes as needed for chest pain (up to 3 doses).   ramipril 2.5 MG capsule Commonly known as:  ALTACE Take 1 capsule (2.5 mg total) by mouth 2 (two) times daily. NEED OV.   ticagrelor 90 MG Tabs tablet Commonly known as:  BRILINTA Take 1 tablet (90 mg total) by mouth 2 (two) times daily.       No orders of the defined types were placed in this encounter.   Immunization History  Administered Date(s) Administered  . Influenza, High Dose Seasonal PF 10/28/2015  . Influenza,inj,Quad PF,36+ Mos 09/13/2014  . PPD Test 01/24/2015    Social History  Substance Use Topics  . Smoking status: Former Smoker    Packs/day: 0.50    Years: 60.00    Types: Cigarettes    Quit date: 07/28/2015  . Smokeless tobacco: Never Used     Comment: a pack day all her life, will try to quit   . Alcohol use No    Review of Systems  UTO 23/2 pt's death; per nursing she was acting a little strange yesterday but no concern for pain , SOB, alteed  MS.,etc    Vitals:   03-27-2016 1100  BP: (!) 109/54  Pulse: 60  Resp: 11  Temp: 97.3 F (36.3 C)   Body mass index is 15.3 kg/m. Physical Exam  GENERAL APPEARANCE: appears dead SKIN: No diaphoresis rash. Lots of old  bruising, cool HEENT: pupils not reactive RESPIRATORY: no breathing  CARDIOVASCULAR: no heart tones No peripheral edema  GASTROINTESTINAL: Abdomen is soft not distended w/ no bowel sounds.  MUSCULOSKELETAL:  thin NEUROLOGIC: no movement   Patient Active Problem List   Diagnosis Date Noted  . UTI (urinary tract infection) 01/24/2016  . Anxiety 01/24/2016  . Asthma 01/24/2016  . Closed nondisplaced fracture of anterior wall of right acetabulum (HCC)   . Closed right acetabular fracture (HCC) 01/16/2016  . Anemia, B12 deficiency 10/28/2015  . COPD with exacerbation (HCC) 10/28/2015  . Diabetes mellitus type II, non insulin dependent (HCC) 07/30/2015  . Anxiety disorder 07/30/2015  . CAD S/P percutaneous coronary angioplasty 09/14/2014  . Essential hypertension 09/14/2014  . Ischemic cardiomyopathy, mild 09/14/2014  . Hyperglycemia 09/14/2014  . NSVT (nonsustained ventricular tachycardia) (HCC) 09/14/2014  . Dementia   . Aortic stenosis   . ST elevation (STEMI) myocardial infarction involving left anterior descending coronary artery (HCC) 09/12/2014  . Mild aortic stenosis by prior echocardiogram 08/17/2014    CMP     Component Value Date/Time   NA 133 (L) 01/17/2016 0428   NA 133 (A) 01/17/2016   K 4.3 01/17/2016 0428   CL 101 01/17/2016 0428   CO2 24 01/17/2016 0428   GLUCOSE 151 (H) 01/17/2016 0428   BUN 26 (H) 01/17/2016 0428   BUN 26 (A) 01/17/2016   CREATININE 0.61 01/17/2016 0428   CALCIUM 8.3 (L) 01/17/2016 0428   PROT 7.5 01/16/2016 1815   ALBUMIN 3.2 (L) 01/16/2016 1815   AST 27 01/16/2016 1815   ALT 22 01/16/2016 1815   ALKPHOS 72 01/16/2016 1815   BILITOT 1.5 (H) 01/16/2016 1815   GFRNONAA >60 01/17/2016 0428   GFRAA >60  01/17/2016 0428    Recent Labs  10/28/15 1157  01/16/16 1815 01/17/16 01/17/16 0428  NA 138  < > 132* 133* 133*  K 4.4  < > 3.8 4.3 4.3  CL 103  --  101  --  101  CO2 30  --  24  --  24  GLUCOSE 145*  --  143*  --  151*  BUN 28*  < > 37* 26* 26*  CREATININE 0.78  < > 0.80 0.6 0.61  CALCIUM 9.5  --  8.4*  --  8.3*  < > = values in this interval not displayed.  Recent Labs  06/29/15 1219 01/16/16 1815  AST 17 27  ALT 12 22  ALKPHOS 106 72  BILITOT 0.6 1.5*  PROT 7.4 7.5  ALBUMIN 3.6 3.2*    Recent Labs  06/29/15 1219  01/16/16 1815 01/17/16 01/17/16 0428  WBC 7.0  < > 10.5 10.5 11.6*  NEUTROABS 5.3  --  9.4*  --   --   HGB 11.1*  < > 11.3* 11.3* 11.6*  HCT 35.1*  < > 33.9* 34* 34.8*  MCV 80.9  --  81.3  --  81.3  PLT 295.0  < > 242 242 246  < > = values in this interval not displayed.  Recent Labs  10/28/15 1157  CHOL 166  LDLCALC 104*  TRIG 87.0   Lab Results  Component Value Date   MICROALBUR 9.1 (H) 10/28/2015   Lab Results  Component Value Date   TSH 4.450 09/12/2014   Lab Results  Component Value Date   HGBA1C 7.7 (H) 10/28/2015   Lab Results  Component Value Date   CHOL 166 10/28/2015   HDL 44.80 10/28/2015   LDLCALC 104 (H) 10/28/2015   TRIG 87.0 10/28/2015   CHOLHDL 4 10/28/2015    Significant Diagnostic Results in last 30 days:  Xr Pelvis 1-2 Views  Result Date: 02/15/2016 xxx  Assessment and Plan  DEATH- pt was pronounced dead at 0955. Son will see her at [place] home.    Randon Goldsmith. Lyn Hollingshead, MD

## 2016-03-16 NOTE — Assessment & Plan Note (Signed)
Controlled; cont Altace 2.5 mg BID and coreg 6.25 mg BID

## 2016-03-16 DEATH — deceased

## 2016-03-29 NOTE — Progress Notes (Signed)
Opened in error; Disregard.

## 2016-04-08 ENCOUNTER — Encounter: Payer: Self-pay | Admitting: Internal Medicine

## 2016-04-08 NOTE — Progress Notes (Signed)
Location:   [facility]ical sales engineer of Service:   SNF  Merrilee Seashore, MD  Patient Care Team: Margit Hanks, MD as PCP - General (Internal Medicine)  Extended Emergency Contact Information Primary Emergency Contact: Wray,David Address: [address]          [place], [place] [address] [place] of [place] Home Phone: [phone] Relation: Son    Allergies: Patient has no known allergies.  Chief Complaint  Patient presents with  . Acute Visit    HPI: Patient is 83 y.o. female who is being seen acutely because an outbreak of Influenza A per CDC guidelines was recognized on 02/16/2016. Pt has no c/o flu like symptoms;therefore pt will need to be prophylaxed with Tamiflu for a minimum of 14 days per CDC protocol.  Past Medical History:  Diagnosis Date  . Anxiety   . Asthma   . CAD S/P percutaneous coronary angioplasty August 2016   a. Anterior STEMI 08/2014 - DES PCI to p-mLAD CAD - ; chronic total occlusion of mRCA with R-R/L-R collaterals, moderate Cx disease. EF 50% with inferior/apical mild HK.  Marland Kitchen COPD (chronic obstructive pulmonary disease) (HCC)   . Degenerative arthritis of lumbar spine   . Dementia   . Depression   . Diabetes mellitus without complication (HCC)    Type II (non-insulin dependent)  . Essential hypertension   . High blood pressure   . High cholesterol   . Hyperglycemia, unspecified    No diagnosis of diabetes  . Ischemic cardiomyopathy    a. Mild - cath 08/2014 showing EF 50% with inferior/apical mild HK, EF 45-50% by echo.  . Mild aortic stenosis by prior echocardiogram    a. Mild AS (mean gradient 12 mmHg, AVA~1.07 cm^2)  . NSVT (nonsustained ventricular tachycardia) (HCC)    a. One run of NSVT on 09/12/14, possibly 2/2 reperfusion.  . ST elevation (STEMI) myocardial infarction involving left anterior descending coronary artery (HCC) 09/12/2014   CTO of RCA with new 100% p-mLAD    Past Surgical History:  Procedure Laterality Date  .  APPENDECTOMY    . CARDIAC CATHETERIZATION N/A 09/12/2014   Procedure: Left Heart Cath and Coronary Angiography;  Surgeon: Lyn Records, MD;  Location: [facility] INVASIVE CV LAB; culprit = 100% pLAD --> PCI, mRCA CTO 100% (R-R & L-R collaterals), Mild-Mod (50%) dCx, Mod  65% RI. Inferior & apical HK - EF ~50%.  . CARDIAC CATHETERIZATION N/A 09/12/2014   Procedure: Coronary Stent Intervention;  Surgeon: Lyn Records, MD;  Location: [facility] INVASIVE CV LAB;  Service: Cardiovascular;  Promus Premier DES 3.0 mm x 20 mm p-mLAD  . CARDIAC SURGERY     pericardial window  . FRACTURE SURGERY    . TRANSTHORACIC ECHOCARDIOGRAM  09/14/2014    Mild LVH. EF 45-50% (mildly reduced). Hypokinesis of mid inferior wall. GR 1 DD with high filling pressures. Mild AMS (mean gradient 12 mmHg, AVA~1.07 cm^2). Mild MR. Mild RV dilation. Moderate RA dilation    Allergies as of 02/16/2016   No Known Allergies     Medication List       Accurate as of 02/16/16 11:59 PM. Always use your most recent med list.          acetaminophen 325 MG tablet Commonly known as:  TYLENOL Take 650 mg by mouth every 6 (six) hours as needed for mild pain.   aspirin 81 MG EC tablet Take 1 tablet (81 mg total) by mouth daily.   atorvastatin 80 MG  tablet Commonly known as:  LIPITOR TAKE 1 TABLET (80 MG TOTAL) BY MOUTH EVERY EVENING.   AZO BLADDER CONTROL/GO-LESS PO Take 1 capsule by mouth 3 times/day as needed-between meals & bedtime.   B-12 2500 MCG Tabs Take 2,500 mcg by mouth daily.   budesonide-formoterol 160-4.5 MCG/ACT inhaler Commonly known as:  SYMBICORT Inhale 2 puffs into the lungs 2 (two) times daily.   carvedilol 6.25 MG tablet Commonly known as:  COREG Take 1 tablet (6.25 mg total) by mouth 2 (two) times daily with a meal.   donepezil 10 MG tablet Commonly known as:  ARICEPT Take 1 tablet (10 mg total) by mouth at bedtime.   ENSURE Take 237 mLs by mouth 2 (two) times daily between meals.   FLUoxetine 20 MG  tablet Commonly known as:  PROZAC Take 1 tablet (20 mg total) by mouth daily.   HYDROcodone-acetaminophen 5-325 MG tablet Commonly known as:  NORCO/VICODIN Take 1-2 tablets by mouth every 6 (six) hours as needed for moderate pain. Give 1 tablet for mild pain and 2 tablets for moderate pain.   loperamide 2 MG capsule Commonly known as:  IMODIUM Take 2 mg by mouth as needed for diarrhea or loose stools. After each loose stool. Do not take more than 8 mg in 24 hours.   metFORMIN 500 MG tablet Commonly known as:  GLUCOPHAGE Take 500 mg by mouth 2 (two) times daily with a meal.   nitroGLYCERIN 0.4 MG SL tablet Commonly known as:  NITROSTAT Place 1 tablet (0.4 mg total) under the tongue every 5 (five) minutes as needed for chest pain (up to 3 doses).   ramipril 2.5 MG capsule Commonly known as:  ALTACE Take 1 capsule (2.5 mg total) by mouth 2 (two) times daily. NEED OV.   ticagrelor 90 MG Tabs tablet Commonly known as:  BRILINTA Take 1 tablet (90 mg total) by mouth 2 (two) times daily.       No orders of the defined types were placed in this encounter.   Immunization History  Administered Date(s) Administered  . Influenza, High Dose Seasonal PF 10/28/2015  . Influenza,inj,Quad PF,36+ Mos 09/13/2014  . PPD Test 01/24/2015    Social History  Substance Use Topics  . Smoking status: Former Smoker    Packs/day: 0.50    Years: 60.00    Types: Cigarettes    Quit date: 07/28/2015  . Smokeless tobacco: Never Used     Comment: a pack day all her life, will try to quit   . Alcohol use No    Review of Systems  DATA OBTAINED: from patient, nurse GENERAL:  no fevers SKIN: No itching, rash HEENT: no rhinorrhea, congestion, ST or ear pain RESPIRATORY: No cough, wheezing, SOB CARDIAC: No chest pain, palpitations, lower extremity edema  GI: No abdominal pain, No N/V/D or constipation, No heartburn or reflux  MUSCULOSKELETAL: No muscle aches NEUROLOGIC: No headache, dizziness    Vitals:   02/16/16 2342  BP: 119/75  Pulse: 69  Resp: 17  Temp: 97.3 F (36.3 C)   There is no height or weight on file to calculate BMI. Physical Exam  GENERAL APPEARANCE: Alert, conversant, No acute distress  SKIN: No diaphoresis rash HEENT: Unremarkable RESPIRATORY: Breathing is even, unlabored. Lung sounds are clear   CARDIOVASCULAR: Heart RRR 4/6 murmur, no rubs or gallops. No peripheral edema  GASTROINTESTINAL: Abdomen is soft, non-tender, not distended w/ normal bowel sounds.   NEUROLOGIC: Cranial nerves 2-12 grossly intact PSYCHIATRIC: baseline, no mental status  changes  Patient Active Problem List   Diagnosis Date Noted  . UTI (urinary tract infection) 01/24/2016  . Anxiety 01/24/2016  . Asthma 01/24/2016  . Closed nondisplaced fracture of anterior wall of right acetabulum (HCC)   . Closed right acetabular fracture (HCC) 01/16/2016  . Anemia, B12 deficiency 10/28/2015  . COPD with exacerbation (HCC) 10/28/2015  . Diabetes mellitus type II, non insulin dependent (HCC) 07/30/2015  . Anxiety disorder 07/30/2015  . CAD S/P percutaneous coronary angioplasty 09/14/2014  . Hypertensive heart disease with heart failure (HCC) 09/14/2014  . Ischemic cardiomyopathy, mild 09/14/2014  . Hyperglycemia 09/14/2014  . NSVT (nonsustained ventricular tachycardia) (HCC) 09/14/2014  . Dementia   . Aortic stenosis   . ST elevation (STEMI) myocardial infarction involving left anterior descending coronary artery (HCC) 09/12/2014  . Mild aortic stenosis by prior echocardiogram 08/17/2014    CMP     Component Value Date/Time   NA 133 (L) 01/17/2016 0428   NA 133 (A) 01/17/2016   K 4.3 01/17/2016 0428   CL 101 01/17/2016 0428   CO2 24 01/17/2016 0428   GLUCOSE 151 (H) 01/17/2016 0428   BUN 26 (H) 01/17/2016 0428   BUN 26 (A) 01/17/2016   CREATININE 0.61 01/17/2016 0428   CALCIUM 8.3 (L) 01/17/2016 0428   PROT 7.5 01/16/2016 1815   ALBUMIN 3.2 (L) 01/16/2016 1815   AST 27  01/16/2016 1815   ALT 22 01/16/2016 1815   ALKPHOS 72 01/16/2016 1815   BILITOT 1.5 (H) 01/16/2016 1815   GFRNONAA >60 01/17/2016 0428   GFRAA >60 01/17/2016 0428    Recent Labs  10/28/15 1157  01/16/16 1815 01/17/16 01/17/16 0428  NA 138  < > 132* 133* 133*  K 4.4  < > 3.8 4.3 4.3  CL 103  --  101  --  101  CO2 30  --  24  --  24  GLUCOSE 145*  --  143*  --  151*  BUN 28*  < > 37* 26* 26*  CREATININE 0.78  < > 0.80 0.6 0.61  CALCIUM 9.5  --  8.4*  --  8.3*  < > = values in this interval not displayed.  Recent Labs  06/29/15 1219 01/16/16 1815  AST 17 27  ALT 12 22  ALKPHOS 106 72  BILITOT 0.6 1.5*  PROT 7.4 7.5  ALBUMIN 3.6 3.2*    Recent Labs  06/29/15 1219  01/16/16 1815 01/17/16 01/17/16 0428  WBC 7.0  < > 10.5 10.5 11.6*  NEUTROABS 5.3  --  9.4*  --   --   HGB 11.1*  < > 11.3* 11.3* 11.6*  HCT 35.1*  < > 33.9* 34* 34.8*  MCV 80.9  --  81.3  --  81.3  PLT 295.0  < > 242 242 246  < > = values in this interval not displayed.  Recent Labs  10/28/15 1157  CHOL 166  LDLCALC 104*  TRIG 87.0   Lab Results  Component Value Date   MICROALBUR 9.1 (H) 10/28/2015   Lab Results  Component Value Date   TSH 4.450 09/12/2014   Lab Results  Component Value Date   HGBA1C 7.7 (H) 10/28/2015   Lab Results  Component Value Date   CHOL 166 10/28/2015   HDL 44.80 10/28/2015   LDLCALC 104 (H) 10/28/2015   TRIG 87.0 10/28/2015   CHOLHDL 4 10/28/2015    Significant Diagnostic Results in last 30 days:  No results found.  Assessment and Plan  EXPOSURE TO FLU/ INFLUENZA OUTBREAK AT SNF-   CrCl calculated by me-    55    Dose for 14 days-  30 mg daily Pt will be monitored daily for flu like symptoms                                                                                 Merrilee Seashore, MD

## 2017-05-31 IMAGING — CR DG CHEST 1V PORT
1 series · 1 of 1 positions shown · non-contrast
Comparison: Frontal and lateral views 06/13/2010

CLINICAL DATA: Left-sided and central chest pain.

EXAM:
PORTABLE CHEST - 1 VIEW

[AP]
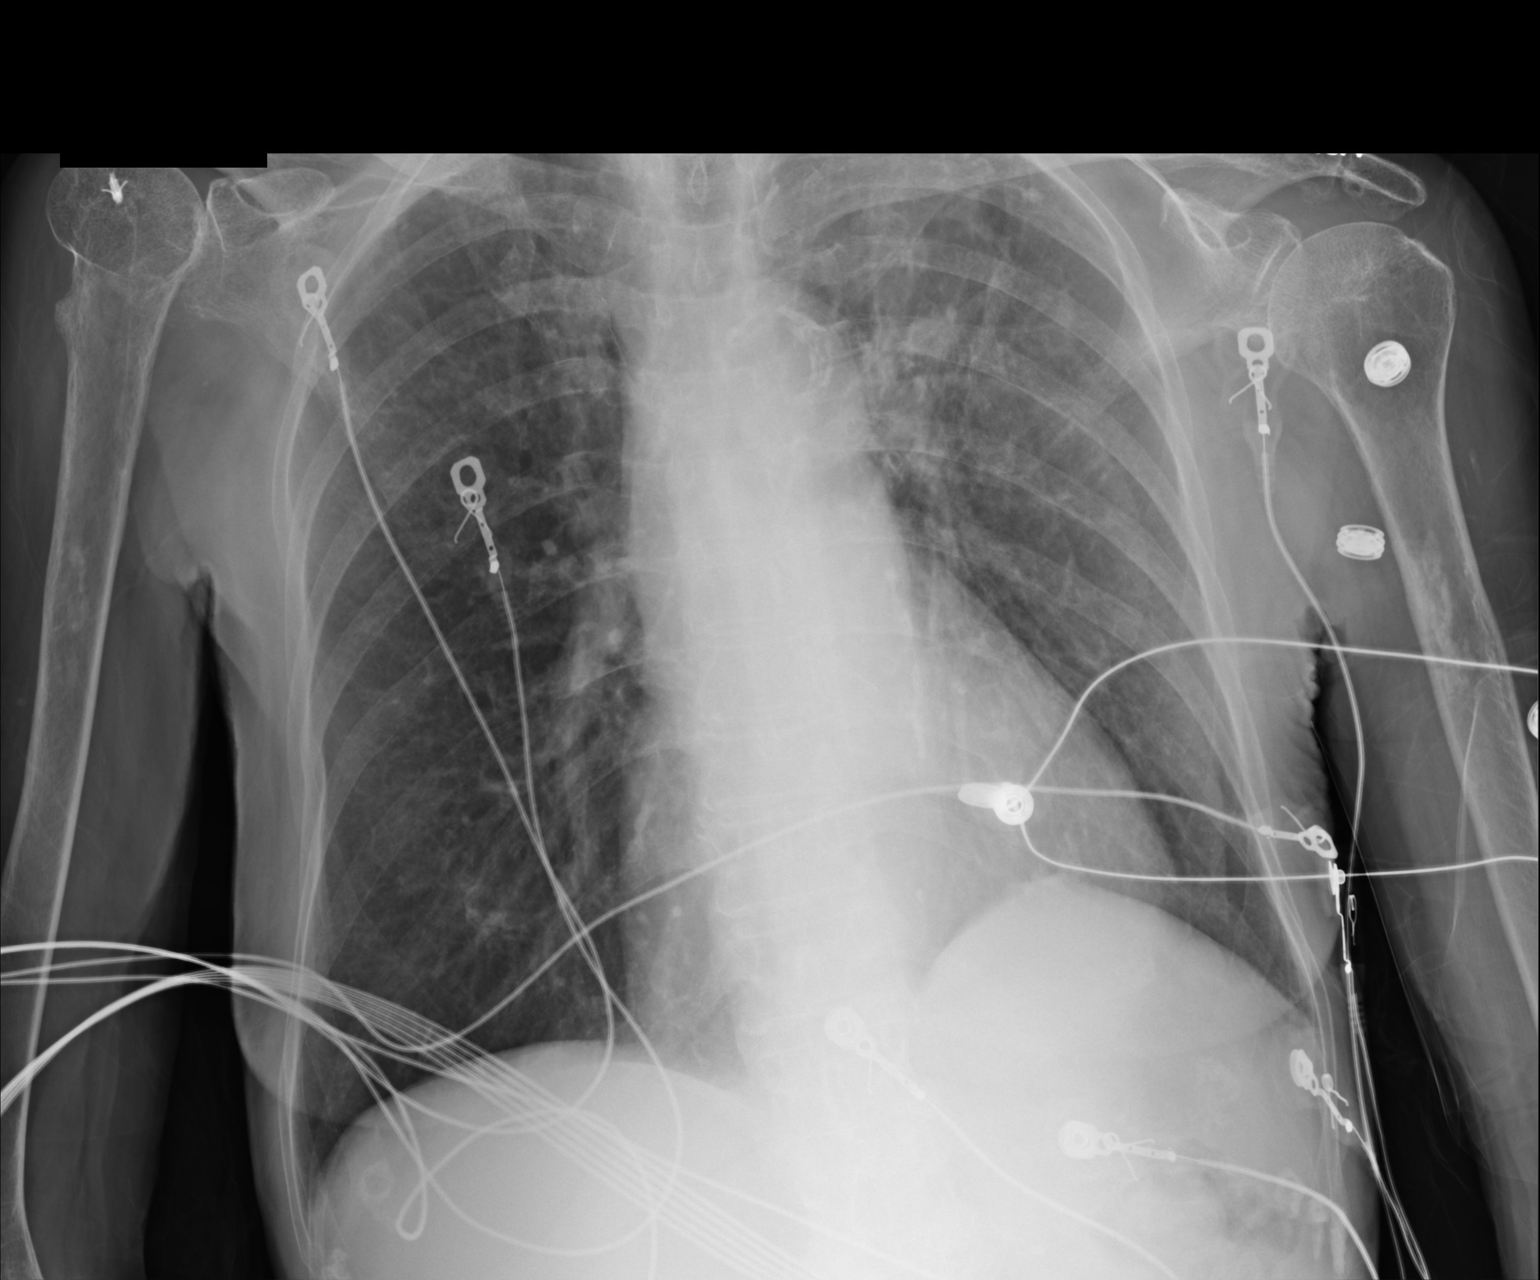

[1 of 1 positions shown; findings below may reference images not displayed]

FINDINGS: Probable volume loss in left hemithorax with ill-defined opacity in
the left suprahilar region. The previous scarring at the left lung
apex is partially obscured. The lungs remain hyperinflated.
Cardiomediastinal contours are unchanged with atherosclerosis of the
thoracic aorta. There is no pulmonary edema. No pneumothorax or
large pleural effusion. Hyperdensity projecting over both proximal
humeri.
IMPRESSION: 1. Hyperinflation. While there are is a history of left apical
scarring, there is progressive volume loss in the left hemithorax
and ill-defined left suprahilar opacity. This may be related to
differences in technique and positioning, however chest CT with
contrast is recommended to exclude underlying mass lesion.
2. Hyperdensities projecting over both proximal humeri. It is
unclear whether this represents vascular calcifications versus true
bone abnormalities. Nonemergent humeral radiographs recommended.

## 2017-06-17 IMAGING — CR DG HUMERUS 2V *L*
2 series · 2 of 2 positions shown · non-contrast
Comparison: September 12, 2014.

CLINICAL DATA: Abnormal x-ray.

EXAM:
LEFT HUMERUS - 2+ VIEW

[humerus ap]
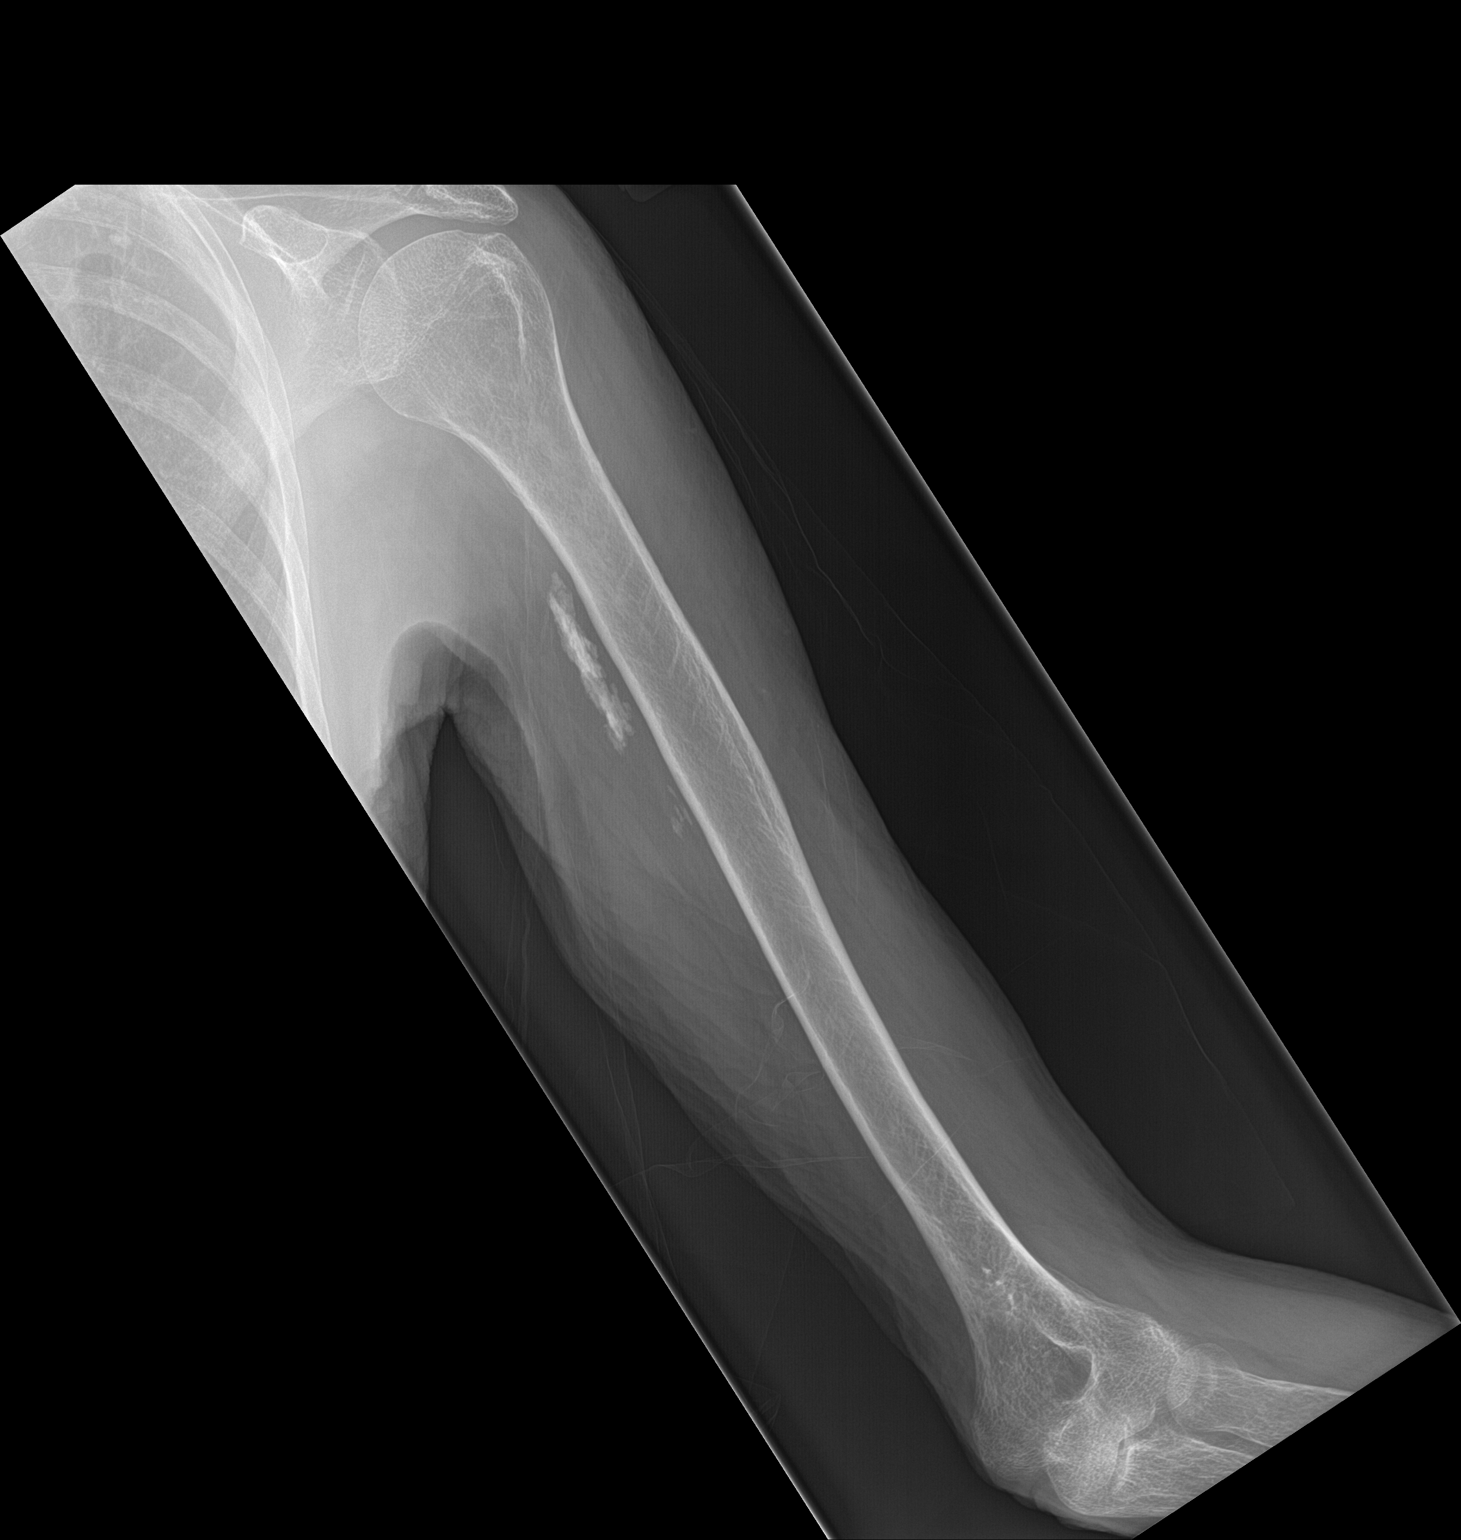

[humerus lat]
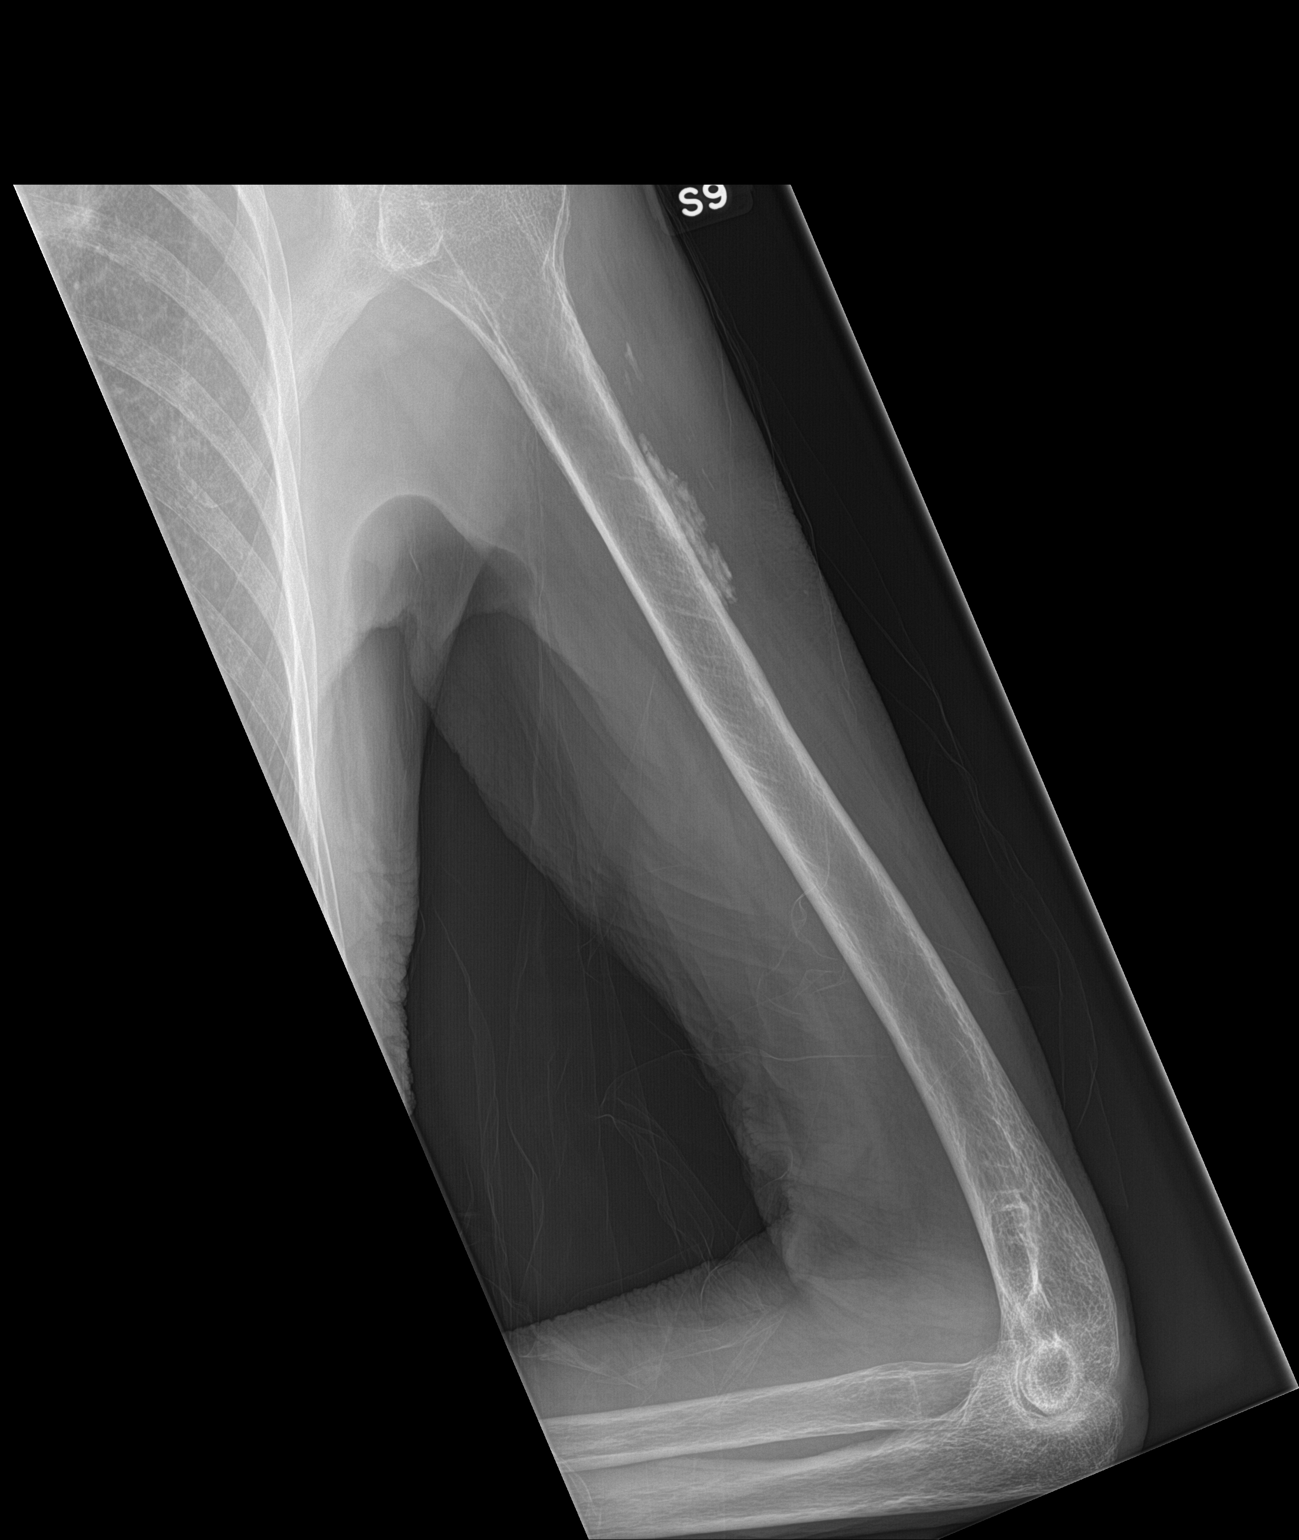

[2 of 2 positions shown; findings below may reference images not displayed]

FINDINGS: There is no evidence of fracture or other focal bone lesions. The
hyperdensities noted on prior chest radiograph represent vascular
calcifications.
IMPRESSION: Normal left humerus. Previously noted hyperdensities in left upper
arm are consistent with vascular calcifications.

## 2017-06-17 IMAGING — CT CT CHEST W/ CM
2 of 3 series · 15 of 36 positions shown, 18 images · IV contrast (Omni 300)
Comparison: Chest x-ray 09/12/2014.

CLINICAL DATA: Abnormal x-ray, density is seen on the left side on
chest x-ray.

EXAM:
CT CHEST WITH CONTRAST
TECHNIQUE: Multidetector CT imaging of the chest was performed during
intravenous contrast administration.
CONTRAST:  75mL OMNIPAQUE IOHEXOL 300 MG/ML  SOLN

[Series 3: thorax 5.0 i31f 1 · axial · 0.59mm/px · z∈[-221,+44]mm · 12 of 63 slices shown, 15 images]
[im 5/63  mediastinal]
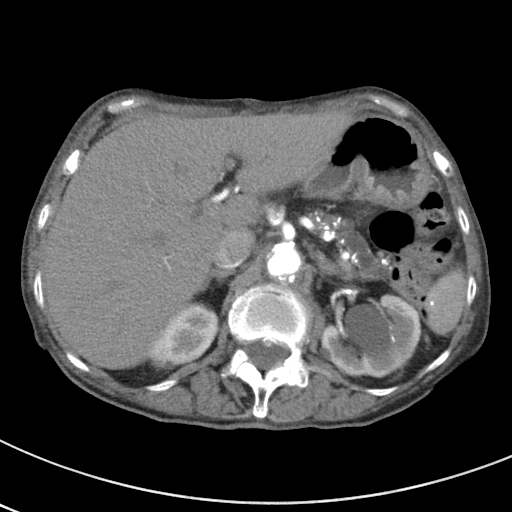
[im 5/63  lung]
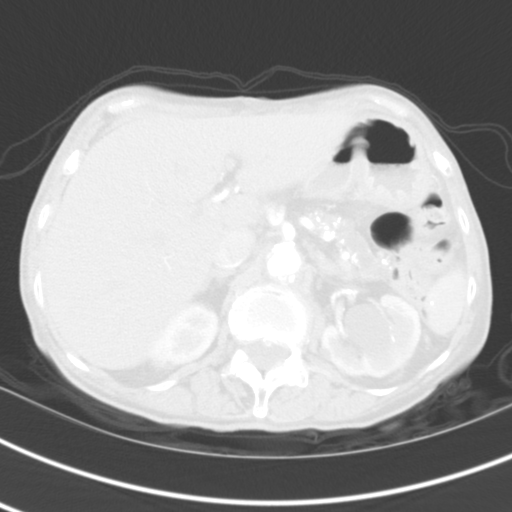
[im 10/63  lung]
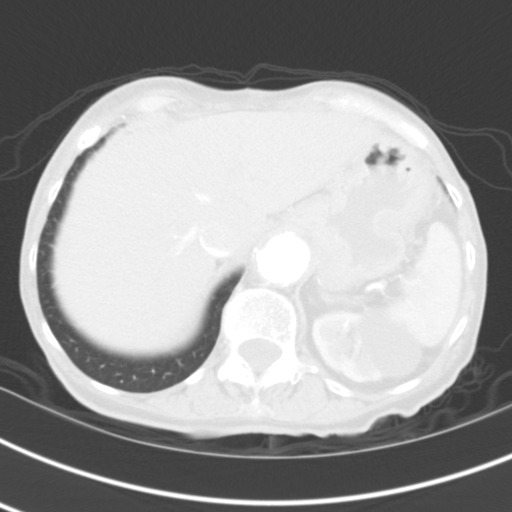
[im 14/63  lung]
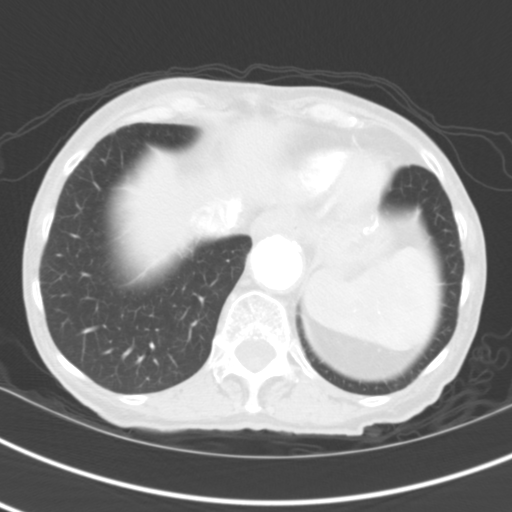
[im 19/63  lung]
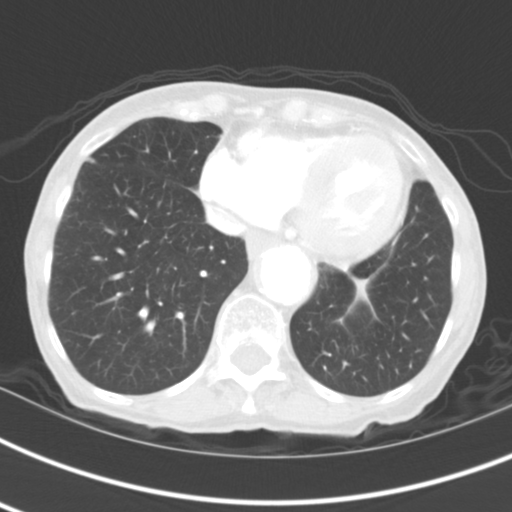
[im 23/63  mediastinal]
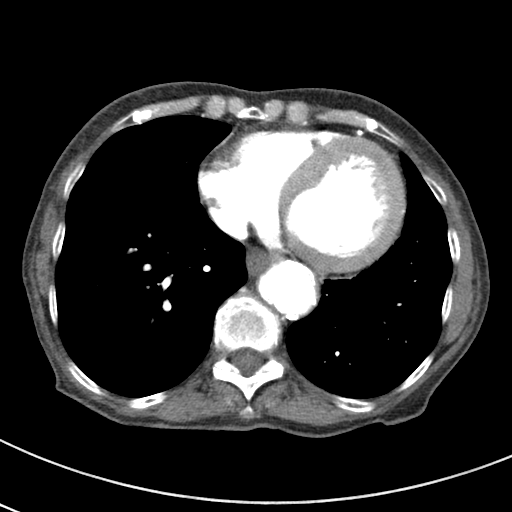
[im 23/63  lung]
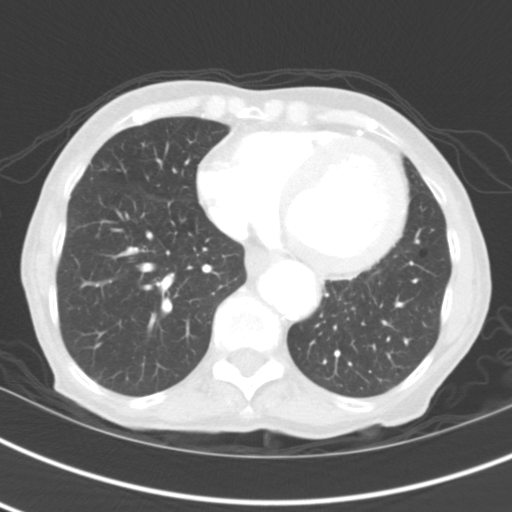
[im 28/63  lung]
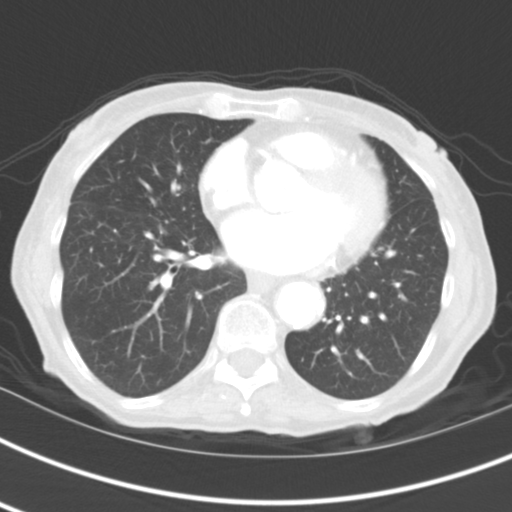
[im 35/63  lung]
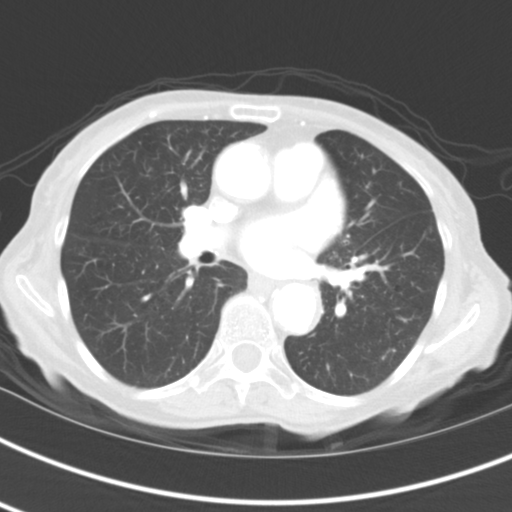
[im 40/63  lung]
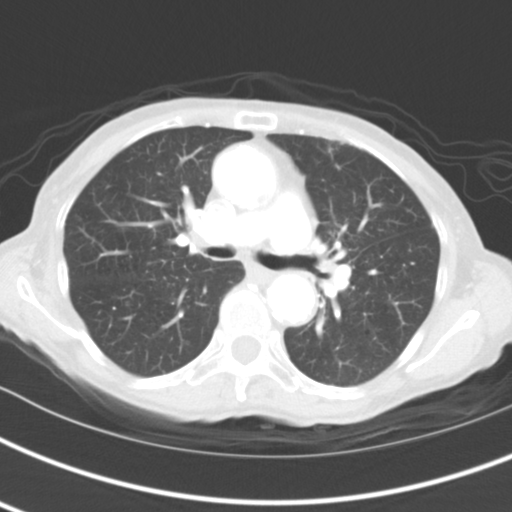
[im 44/63  mediastinal]
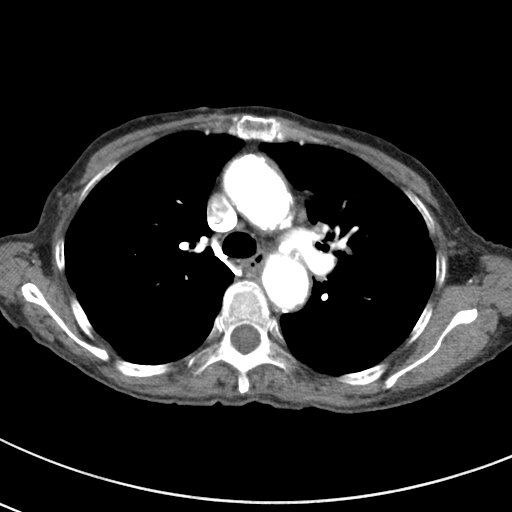
[im 44/63  lung]
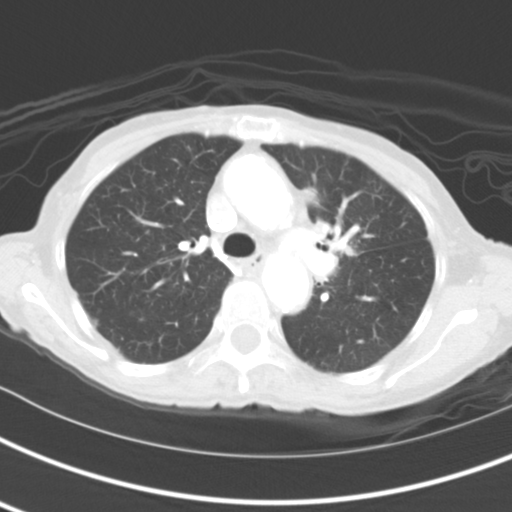
[im 49/63  lung]
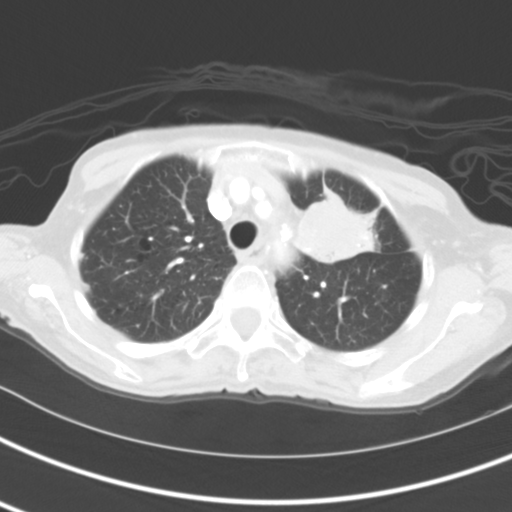
[im 53/63  lung]
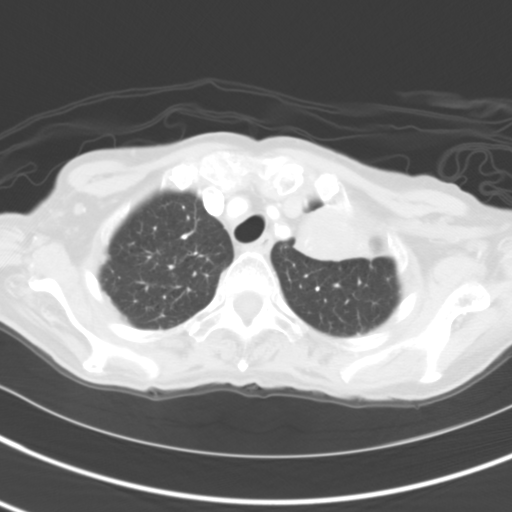
[im 58/63  lung]
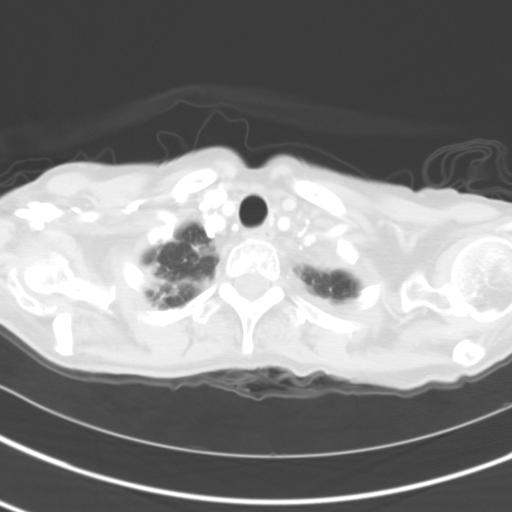

[Series 6: coronal · coronal · 0.58mm/px · 3 of 71 slices shown]
[im 15/71  lung]
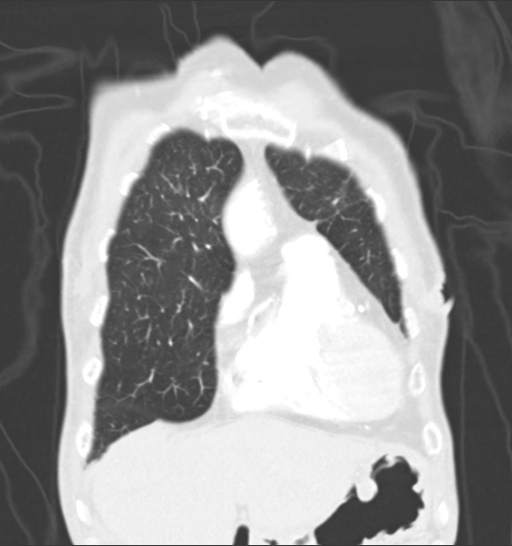
[im 29/71  lung]
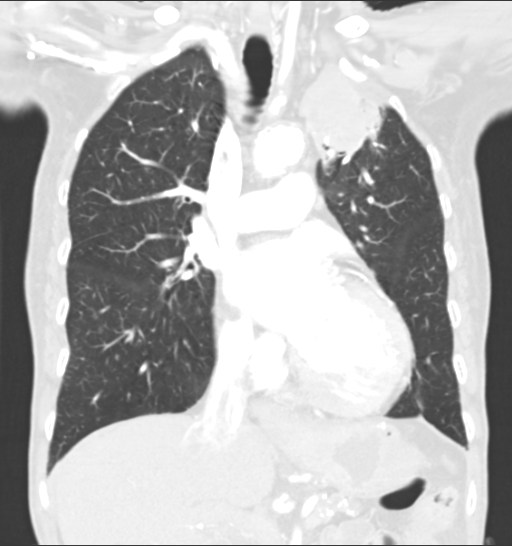
[im 43/71  lung]
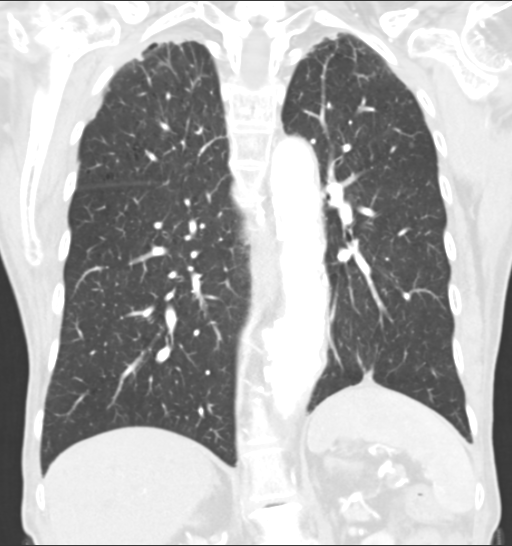

[15 of 36 positions shown; findings below may reference images not displayed]

FINDINGS: There is a rounded masslike opacity in the left upper lobe measuring
4.8 x 3.7 cm. This is concerning for malignancy. This extends from
the left superior hilum to the anterior apical pleural surface.

Small subpleural nodules in the right lower lobe measuring 3 mm or
less in size no additional pulmonary nodules. Biapical scarring. No
pleural effusions.

There is mild cardiomegaly. Coronary artery calcifications and
aortic calcifications present. Ectasia and tortuosity of the
thoracic aorta. No mediastinal, hilar, or axillary adenopathy. Chest
wall soft tissues are unremarkable. Imaging into the upper abdomen
shows no acute findings.
IMPRESSION: 4.8 cm rounded masslike opacity in the left upper lobe concerning
for malignancy. May consider further evaluation with PET CT.

Small right lower lobe subpleural nodules, nonspecific. Recommend
attention on followup imaging.

Coronary artery and aortic atherosclerosis.

Mild cardiomegaly.

## 2018-03-17 IMAGING — DX DG RIBS W/ CHEST 3+V*R*
3 series · 3 of 3 positions shown · non-contrast
Comparison: Chest CT dated 09/29/2014 and chest x-ray dated
09/12/2014.

CLINICAL DATA: Right axillary rib pain for 2 days. History of
hypertension, asthma. Smoker.

EXAM:
RIGHT RIBS AND CHEST - 3+ VIEW

[chest pa]
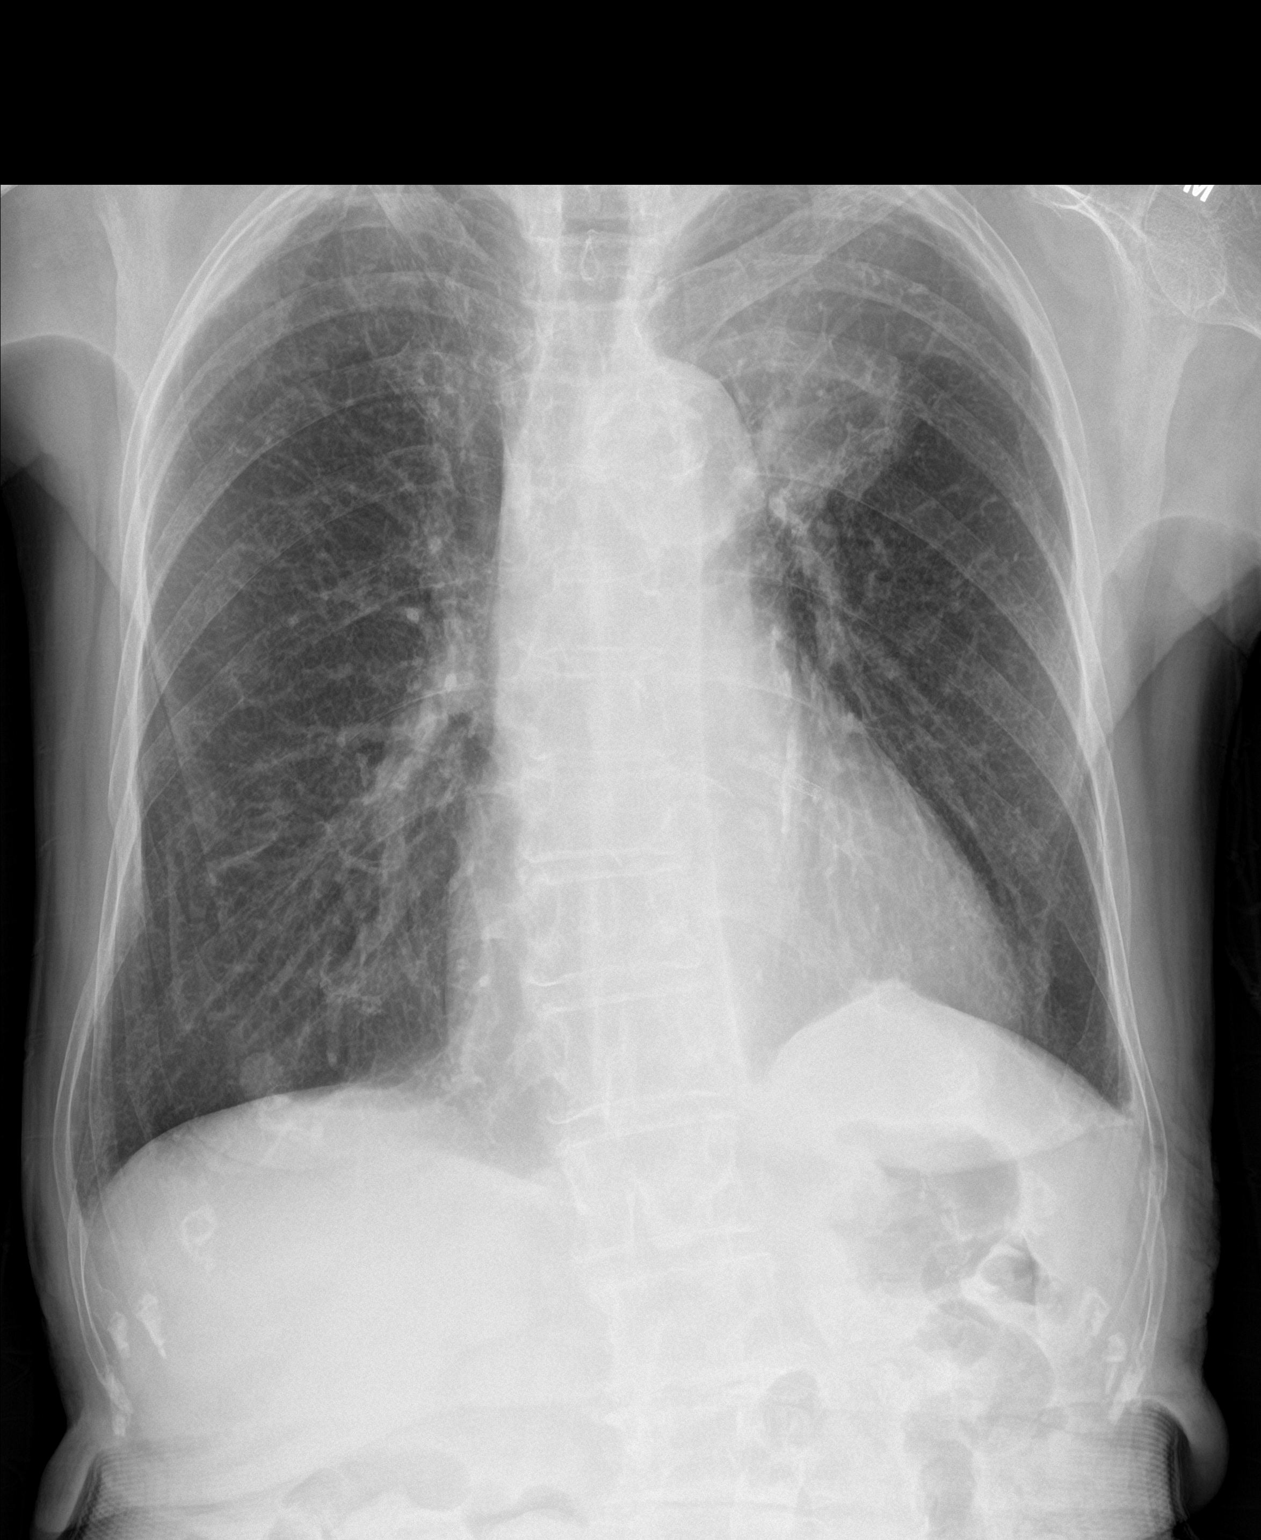

[rib pa]
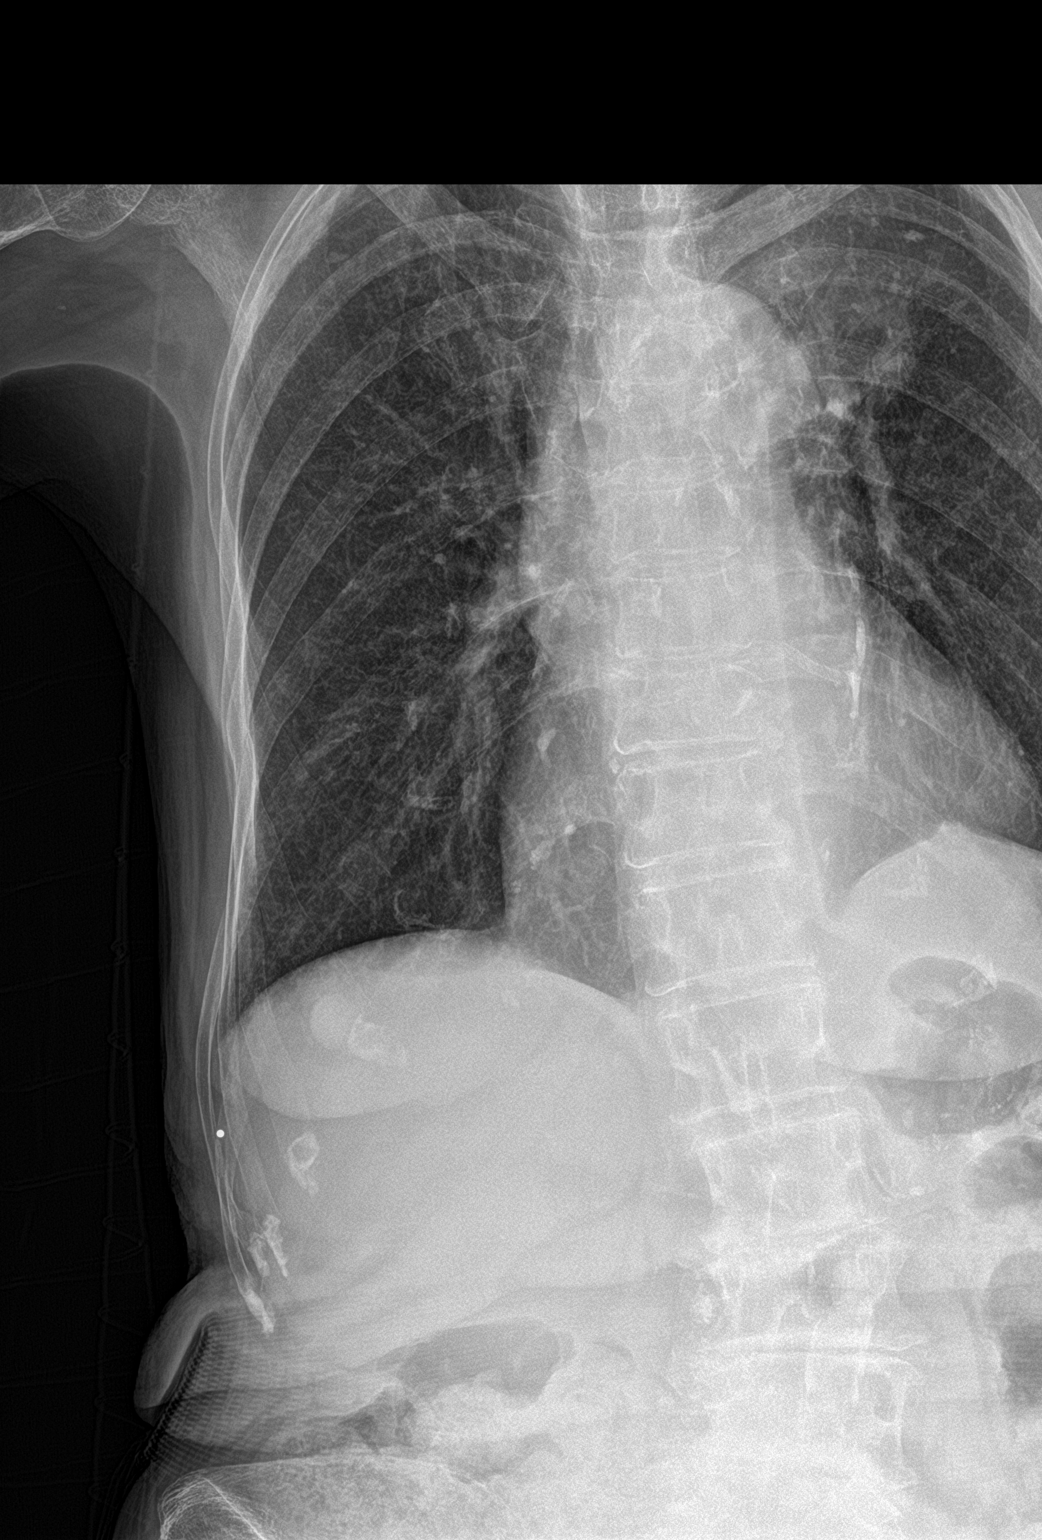

[rib obl]
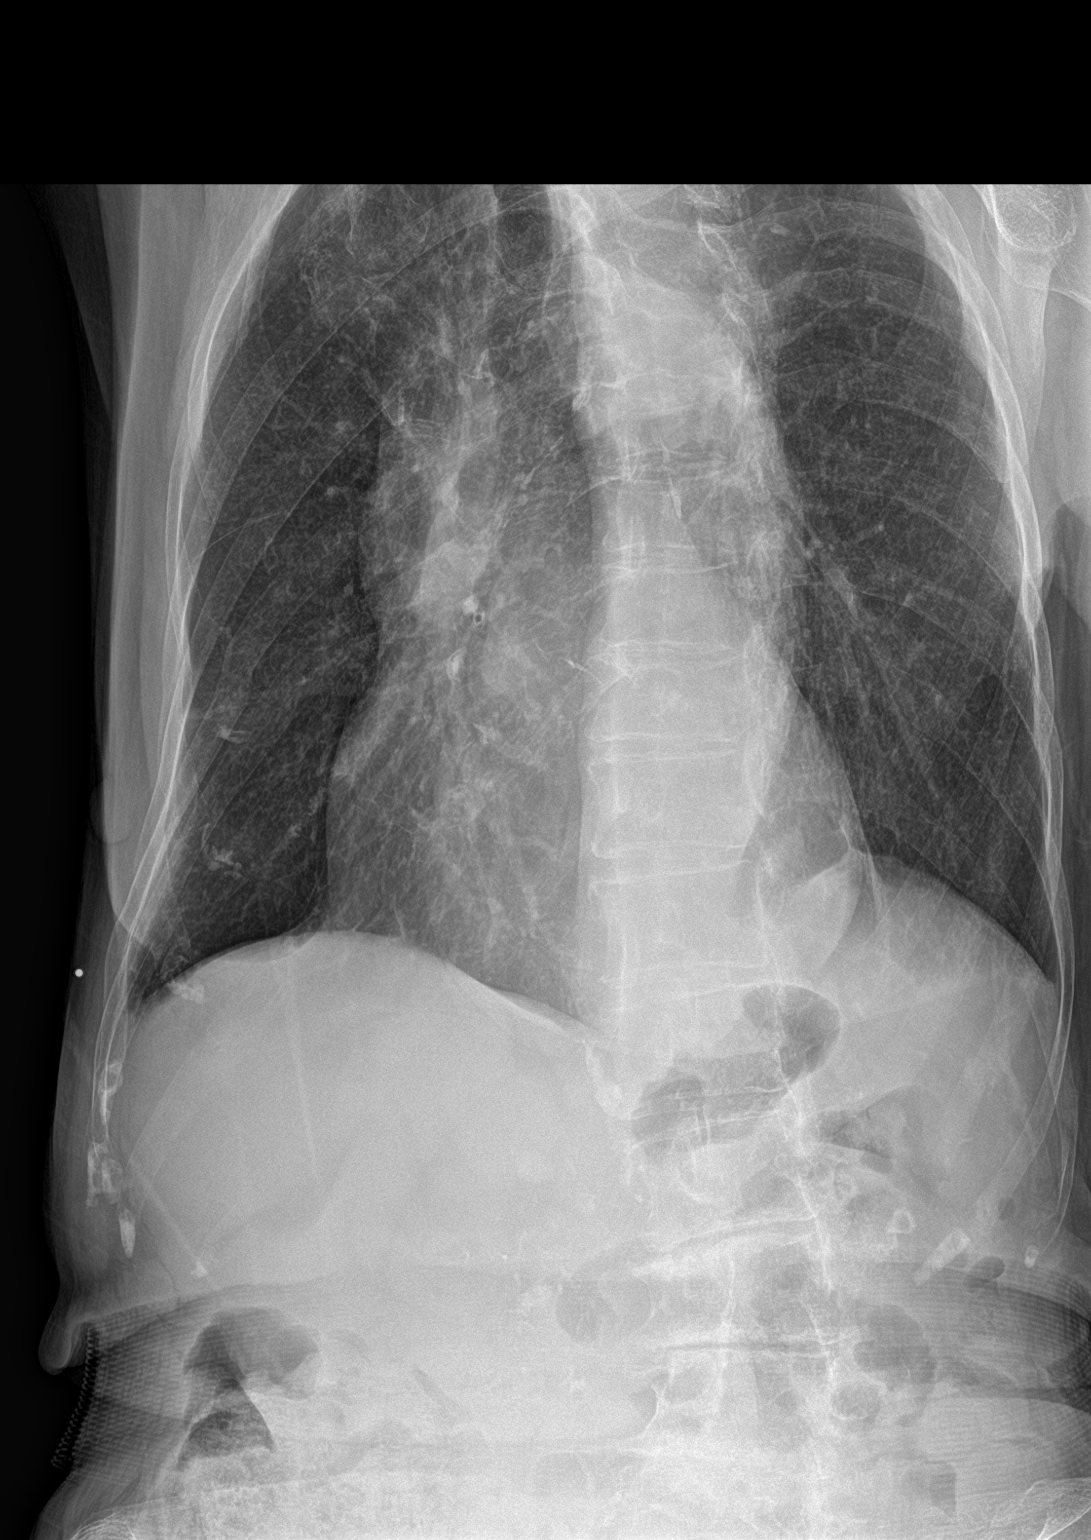

[3 of 3 positions shown; findings below may reference images not displayed]

FINDINGS: Single view of the chest and two views of the right ribs are
provided.

There is a large opacity at the left lung apex, corresponding to the
masslike structure described in the left upper lobe on chest CT of
09/29/2014.

Lungs again appear hyperexpanded suggesting some degree of COPD,
with probable associated chronic bronchitic changes centrally.

No new lung findings. Nodular density overlying the right lung base
is almost certainly a nipple shadow. No pleural effusion or
pneumothorax seen.

Cardiomediastinal silhouette is stable in size and configuration.
Atherosclerotic changes again noted at the aortic arch.

Osseous structures about the chest are unremarkable. Osteopenia
limits characterization of osseous detail, but there is no
acute-appearing fracture line or displaced fracture fragment
identified. Old healed rib fracture is stable. Degenerative changes
noted within the scoliotic upper lumbar spine.
IMPRESSION: 1. No acute rib fracture identified.
2. Large masslike opacity at the left lung apex, corresponding to
the 4.8 cm rounded masslike structure described in the left upper
lobe on earlier chest CT of 09/29/2014. As discussed on that CT
report, would consider further evaluation with PET-CT.
3. Hyperexpanded lungs suggesting COPD/emphysema. Suspect associated
chronic bronchitic changes centrally.
4. No new lung findings seen.
5. Stable mild cardiomegaly.
These results will be called to the ordering clinician or
representative by the Radiologist Assistant, and communication
documented in the PACS or zVision Dashboard.
